# Patient Record
Sex: Female | Born: 1980 | Hispanic: No | Marital: Married | Smoking: Never smoker
Health system: Southern US, Community
[De-identification: ages and names within clinical notes are randomized; demographics above are authoritative.]

## PROBLEM LIST (undated history)

## (undated) ENCOUNTER — Inpatient Hospital Stay (HOSPITAL_COMMUNITY): Payer: Self-pay

## (undated) DIAGNOSIS — Z789 Other specified health status: Secondary | ICD-10-CM

## (undated) HISTORY — PX: NO PAST SURGERIES: SHX2092

---

## 2005-12-07 ENCOUNTER — Inpatient Hospital Stay (HOSPITAL_COMMUNITY): Admission: AD | Admit: 2005-12-07 | Discharge: 2005-12-09 | Payer: Self-pay | Admitting: Obstetrics & Gynecology

## 2010-02-01 ENCOUNTER — Inpatient Hospital Stay (HOSPITAL_COMMUNITY): Admission: AD | Admit: 2010-02-01 | Discharge: 2010-02-02 | Payer: Self-pay | Admitting: Obstetrics and Gynecology

## 2010-06-19 ENCOUNTER — Inpatient Hospital Stay (HOSPITAL_COMMUNITY)
Admission: AD | Admit: 2010-06-19 | Discharge: 2010-06-19 | Disposition: A | Discharge status: Home / Self Care | Payer: Medicaid Other | Source: Ambulatory Visit | Attending: Obstetrics and Gynecology | Admitting: Obstetrics and Gynecology

## 2010-06-19 DIAGNOSIS — N72 Inflammatory disease of cervix uteri: Secondary | ICD-10-CM | POA: Insufficient documentation

## 2010-06-19 DIAGNOSIS — R300 Dysuria: Secondary | ICD-10-CM

## 2010-06-19 DIAGNOSIS — N390 Urinary tract infection, site not specified: Secondary | ICD-10-CM

## 2010-06-19 LAB — URINALYSIS, ROUTINE W REFLEX MICROSCOPIC
Bilirubin Urine: NEGATIVE
Nitrite: NEGATIVE
Specific Gravity, Urine: 1.015 (ref 1.005–1.030)
Urobilinogen, UA: 0.2 mg/dL (ref 0.0–1.0)

## 2010-06-19 LAB — URINE MICROSCOPIC-ADD ON

## 2010-06-19 LAB — WET PREP, GENITAL

## 2010-06-19 LAB — POCT PREGNANCY, URINE: Preg Test, Ur: NEGATIVE

## 2010-06-20 LAB — GC/CHLAMYDIA PROBE AMP, GENITAL: GC Probe Amp, Genital: NEGATIVE

## 2010-06-26 ENCOUNTER — Inpatient Hospital Stay (HOSPITAL_COMMUNITY)
Admission: AD | Admit: 2010-06-26 | Discharge: 2010-06-26 | Disposition: A | Discharge status: Home / Self Care | Payer: Medicaid Other | Source: Ambulatory Visit | Attending: Obstetrics & Gynecology | Admitting: Obstetrics & Gynecology

## 2010-06-26 DIAGNOSIS — R300 Dysuria: Secondary | ICD-10-CM | POA: Insufficient documentation

## 2010-06-26 LAB — URINALYSIS, ROUTINE W REFLEX MICROSCOPIC
Leukocytes, UA: NEGATIVE
Protein, ur: NEGATIVE mg/dL
Urine Glucose, Fasting: NEGATIVE mg/dL
Urobilinogen, UA: 0.2 mg/dL (ref 0.0–1.0)

## 2010-06-26 LAB — URINE MICROSCOPIC-ADD ON

## 2010-06-28 LAB — URINE CULTURE
Colony Count: NO GROWTH
Culture: NO GROWTH

## 2010-07-03 ENCOUNTER — Inpatient Hospital Stay (HOSPITAL_COMMUNITY)
Admission: AD | Admit: 2010-07-03 | Discharge: 2010-07-03 | Disposition: A | Discharge status: Home / Self Care | Payer: Medicaid Other | Source: Ambulatory Visit | Attending: Obstetrics & Gynecology | Admitting: Obstetrics & Gynecology

## 2010-07-03 ENCOUNTER — Inpatient Hospital Stay (HOSPITAL_COMMUNITY): Payer: Medicaid Other

## 2010-07-03 DIAGNOSIS — R300 Dysuria: Secondary | ICD-10-CM | POA: Insufficient documentation

## 2010-07-03 DIAGNOSIS — N949 Unspecified condition associated with female genital organs and menstrual cycle: Secondary | ICD-10-CM | POA: Insufficient documentation

## 2010-07-03 LAB — URINALYSIS, ROUTINE W REFLEX MICROSCOPIC
Leukocytes, UA: NEGATIVE
Protein, ur: NEGATIVE mg/dL
Urobilinogen, UA: 0.2 mg/dL (ref 0.0–1.0)

## 2010-07-03 LAB — URINE MICROSCOPIC-ADD ON

## 2010-07-20 LAB — URINALYSIS, ROUTINE W REFLEX MICROSCOPIC
Bilirubin Urine: NEGATIVE
Nitrite: NEGATIVE
Specific Gravity, Urine: >1.03 — ABNORMAL HIGH (ref 1.005–1.030)
pH: 5.5 (ref 5.0–8.0)

## 2010-07-20 LAB — POCT PREGNANCY, URINE: Preg Test, Ur: POSITIVE

## 2010-07-20 LAB — URINE MICROSCOPIC-ADD ON

## 2010-07-20 LAB — WET PREP, GENITAL

## 2010-07-20 LAB — CBC
MCHC: 34.3 g/dL (ref 30.0–36.0)
MCV: 92.3 fL (ref 78.0–100.0)
Platelets: 235 10*3/uL (ref 150–400)
RDW: 13.4 % (ref 11.5–15.5)
WBC: 11.1 10*3/uL — ABNORMAL HIGH (ref 4.0–10.5)

## 2010-07-20 LAB — GC/CHLAMYDIA PROBE AMP, GENITAL: GC Probe Amp, Genital: NEGATIVE

## 2010-08-25 ENCOUNTER — Inpatient Hospital Stay (HOSPITAL_COMMUNITY): Admission: AD | Admit: 2010-08-25 | Payer: Self-pay | Admitting: Family Medicine

## 2010-12-27 ENCOUNTER — Other Ambulatory Visit: Payer: Self-pay | Admitting: Family Medicine

## 2011-05-08 NOTE — L&D Delivery Note (Signed)
Delivery Note At 3:55 AM a viable and healthy female was delivered in car at door of [facility] via Vaginal, Spontaneous Delivery (Presentation: unknown ).  APGAR: pending, ; weight pending .   Placenta status: intact , .  Cord:  with the following complications: none .  Cord pH: n/a  Anesthesia:  None Episiotomy: None Lacerations: 2nd Degree Suture Repair: 3.0 vicryl rapide Est. Blood Loss (mL): 350  Mom to postpartum.  Baby to nursery-stable.  [facility] 12/16/2011, 4:24 AM

## 2011-05-09 IMAGING — US US RENAL
1 series · 14 of 25 positions shown · non-contrast
Comparison: None

CLINICAL DATA: Suprapubic pain and dysuria.  Micro hematuria.

RENAL/URINARY TRACT ULTRASOUND COMPLETE

[Series 1: us renal · 14 of 61 slices shown]
[im 1/61]
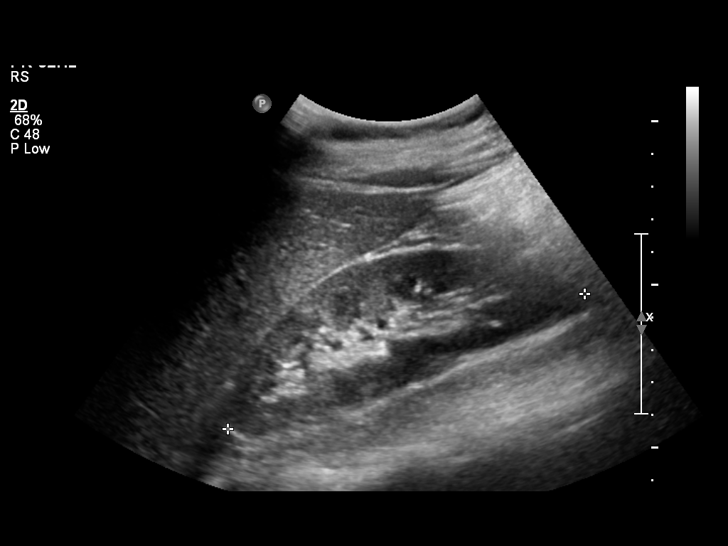
[im 6/61]
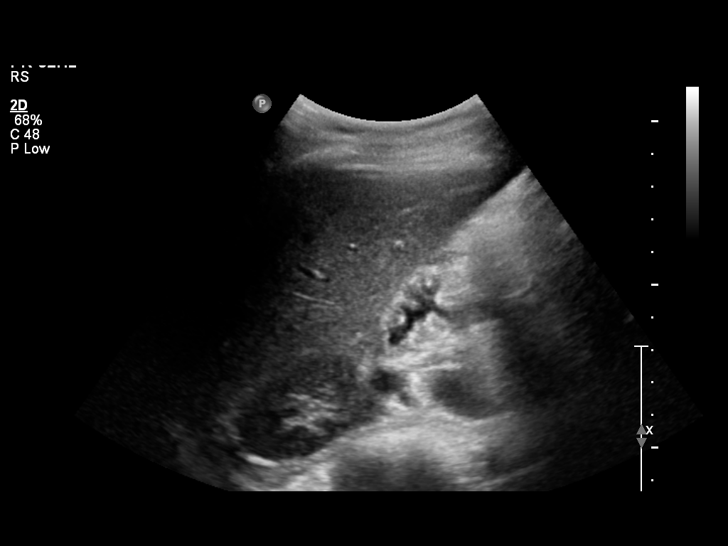
[im 11/61]
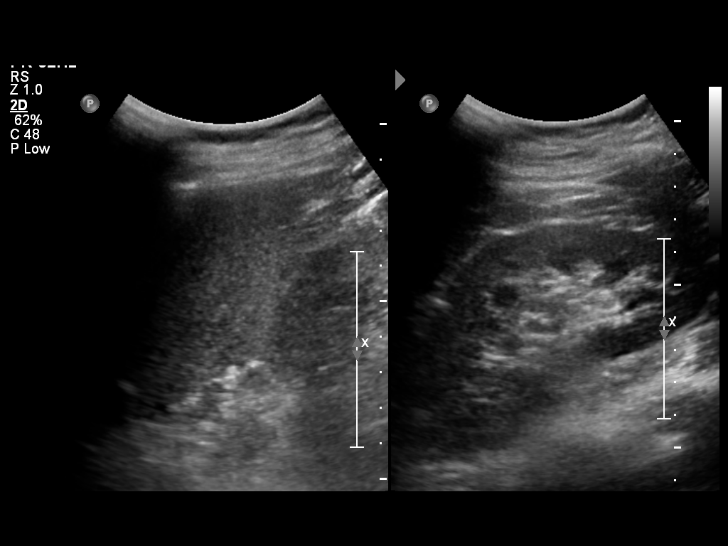
[im 16/61]
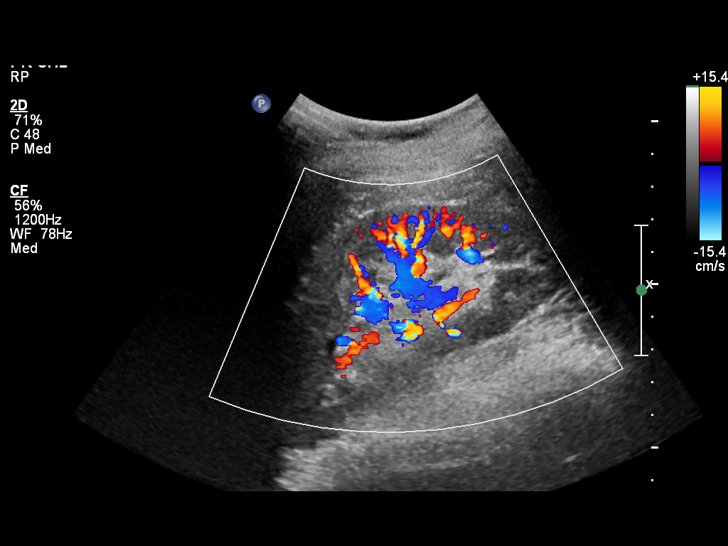
[im 21/61]
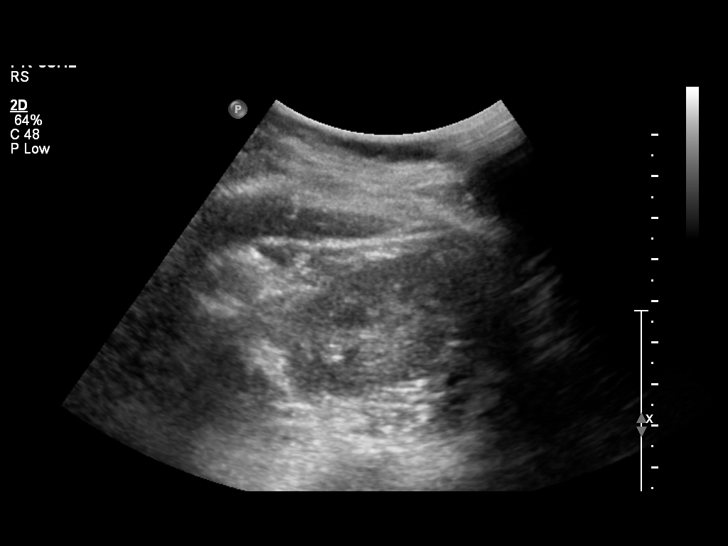
[im 23/61]
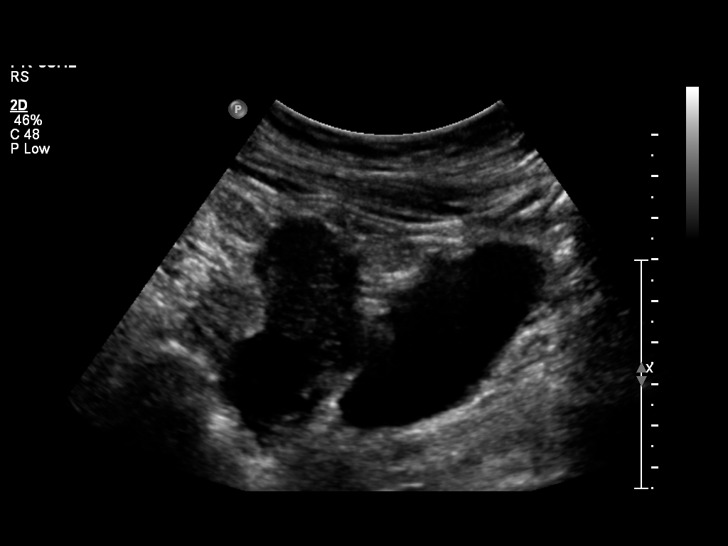
[im 28/61]
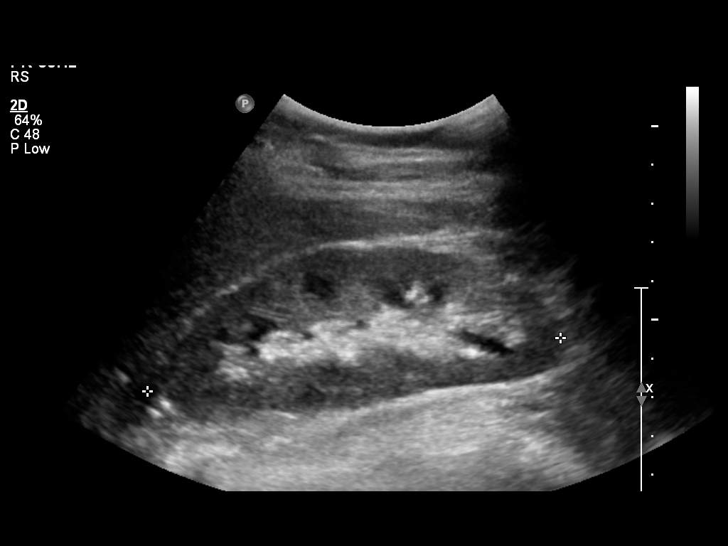
[im 33/61]
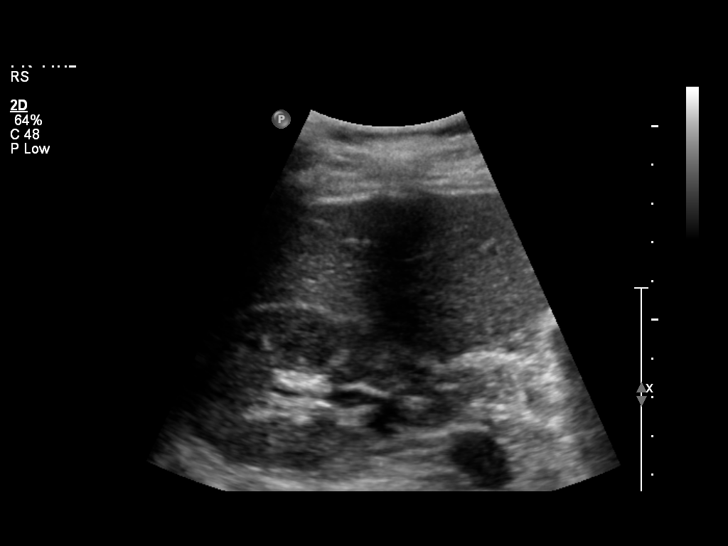
[im 38/61]
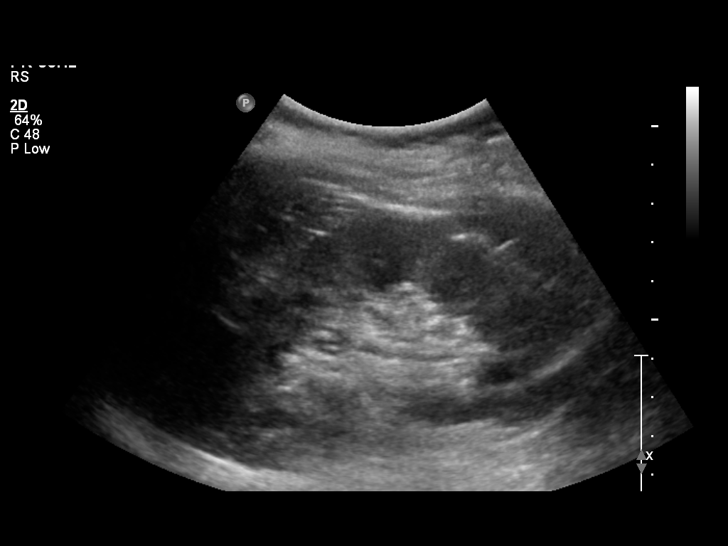
[im 41/61]
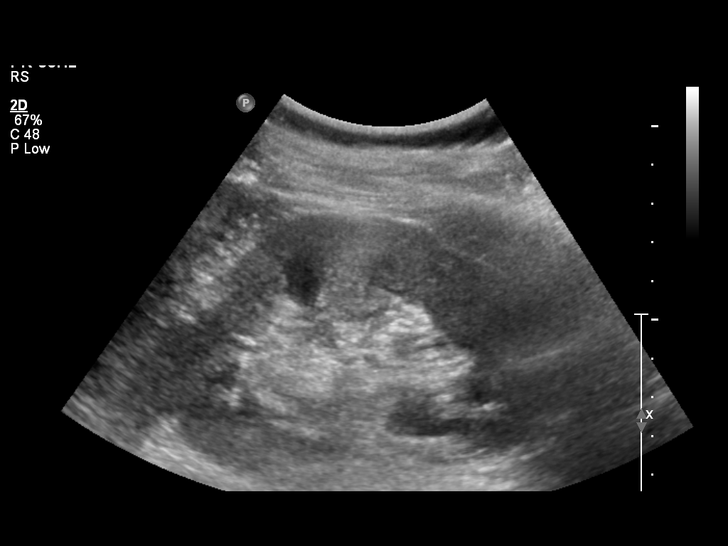
[im 46/61]
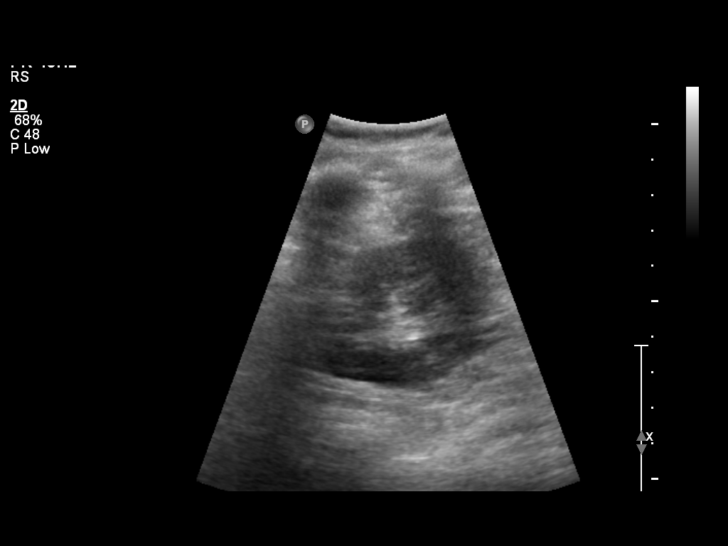
[im 51/61]
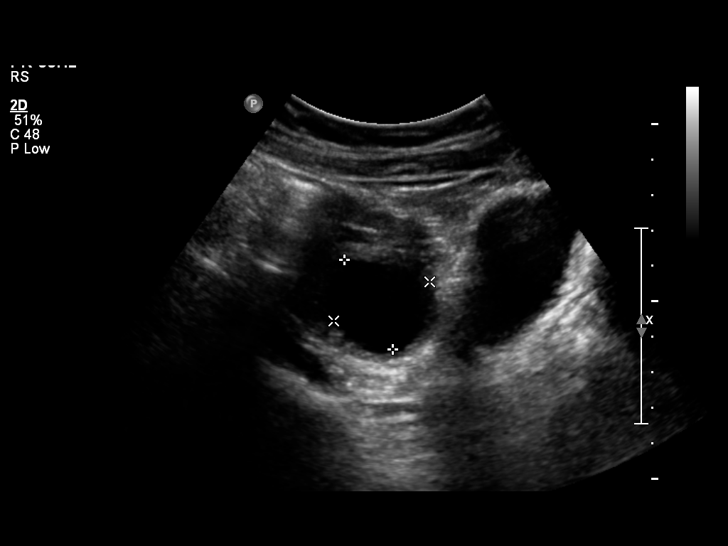
[im 56/61]
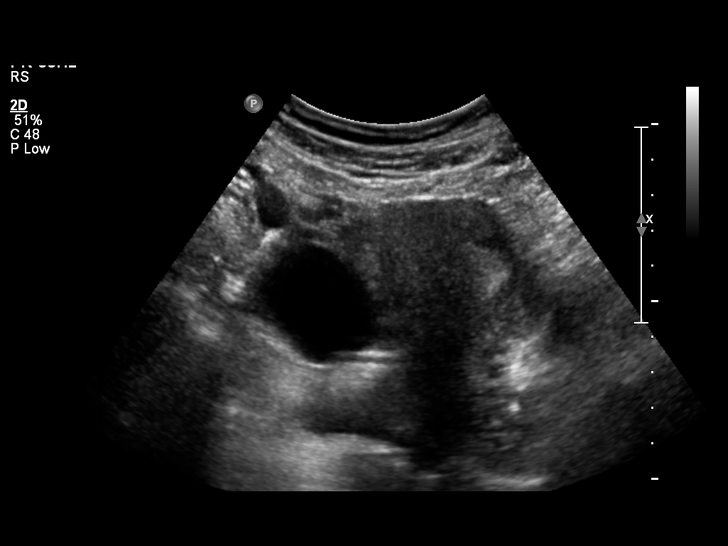
[im 61/61]
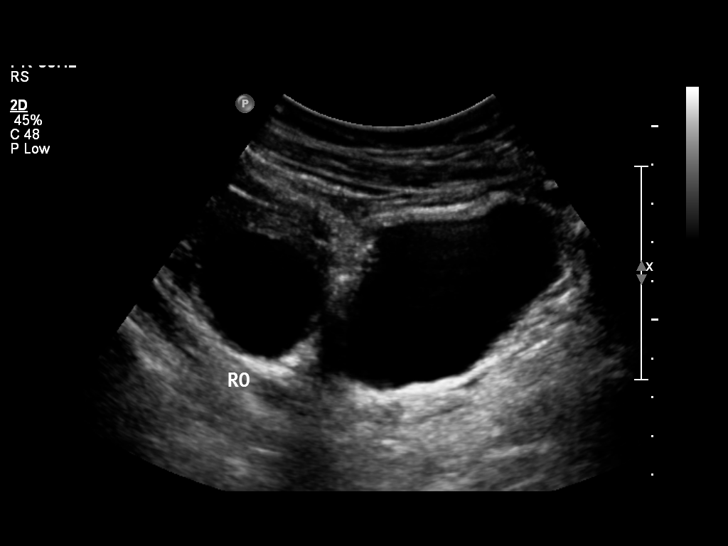

[14 of 25 positions shown; findings below may reference images not displayed]

FINDINGS: Right Kidney:  11.7 cm in length. Normal renal cortical thickness
and echogenicity without focal lesions or hydronephrosis.

Left Kidney:  11.3 cm in length. Normal renal cortical thickness
and echogenicity without focal lesions or hydronephrosis.

Bladder:  Normal.  Bilateral ureteral jets are demonstrated.
Incidental 2.9 x 2.6 x 2.9 cm right ovarian cyst is noted.
IMPRESSION: Normal renal ultrasound examination.

## 2011-06-22 ENCOUNTER — Inpatient Hospital Stay (HOSPITAL_COMMUNITY)
Admission: AD | Admit: 2011-06-22 | Discharge: 2011-06-22 | Disposition: A | Discharge status: Home / Self Care | Payer: Medicaid Other | Source: Ambulatory Visit | Attending: Obstetrics & Gynecology | Admitting: Obstetrics & Gynecology

## 2011-06-22 ENCOUNTER — Encounter (HOSPITAL_COMMUNITY): Payer: Self-pay | Admitting: *Deleted

## 2011-06-22 DIAGNOSIS — B3731 Acute candidiasis of vulva and vagina: Secondary | ICD-10-CM | POA: Insufficient documentation

## 2011-06-22 DIAGNOSIS — O26899 Other specified pregnancy related conditions, unspecified trimester: Secondary | ICD-10-CM

## 2011-06-22 DIAGNOSIS — R300 Dysuria: Secondary | ICD-10-CM | POA: Insufficient documentation

## 2011-06-22 DIAGNOSIS — B373 Candidiasis of vulva and vagina: Secondary | ICD-10-CM | POA: Insufficient documentation

## 2011-06-22 HISTORY — DX: Other specified health status: Z78.9

## 2011-06-22 LAB — URINALYSIS, ROUTINE W REFLEX MICROSCOPIC
Nitrite: NEGATIVE
Specific Gravity, Urine: 1.02 (ref 1.005–1.030)
Urobilinogen, UA: 0.2 mg/dL (ref 0.0–1.0)

## 2011-06-22 LAB — URINE MICROSCOPIC-ADD ON

## 2011-06-22 LAB — PREGNANCY, URINE: Preg Test, Ur: POSITIVE — AB

## 2011-06-22 LAB — WET PREP, GENITAL: Clue Cells Wet Prep HPF POC: NONE SEEN

## 2011-06-22 MED ORDER — FLUCONAZOLE 150 MG PO TABS
150.0000 mg | ORAL_TABLET | Freq: Once | ORAL | Status: AC
  Administered 2011-06-22: 150 mg via ORAL
  Filled 2011-06-22: qty 1

## 2011-06-22 MED ORDER — NITROFURANTOIN MONOHYD MACRO 100 MG PO CAPS: 100.0000 mg | ORAL_CAPSULE | Freq: Two times a day (BID) | ORAL | Status: AC

## 2011-06-22 MED ORDER — NITROFURANTOIN MONOHYD MACRO 100 MG PO CAPS
100.0000 mg | ORAL_CAPSULE | Freq: Once | ORAL | Status: AC
  Administered 2011-06-22: 100 mg via ORAL
  Filled 2011-06-22: qty 1

## 2011-06-22 NOTE — ED Provider Notes (Signed)
Medical Screening exam and patient care preformed by advanced practice provider.  Agree with the above management.  

## 2011-06-22 NOTE — ED Provider Notes (Signed)
History     Chief Complaint  Patient presents with  . Dysuria   HPI  Pt is [redacted]w[redacted]d pregnant by LMP 03/15/2011.  She presents with pain and burning with urination for 3 days.  She denies vaginal bleeding or spotting, chills or fever.    Past Medical History  Diagnosis Date  . No pertinent past medical history     Past Surgical History  Procedure Date  . No past surgeries     Family History  Problem Relation Age of Onset  . Anesthesia problems Neg Hx     History  Substance Use Topics  . Smoking status: Never Smoker   . Smokeless tobacco: Never Used  . Alcohol Use: No    Allergies: No Known Allergies  No prescriptions prior to admission    ROS Physical Exam   Blood pressure 105/71, pulse 93, temperature 98.5 F (36.9 C), temperature source Oral, resp. rate 20, height 5\' 2"  (1.575 m), weight 146 lb (66.225 kg), last menstrual period 03/15/2011, SpO2 100.00%.  Physical Exam  Vitals reviewed. Constitutional: She is oriented to person, place, and time. She appears well-developed and well-nourished.  HENT:  Head: Normocephalic.  Eyes: Pupils are equal, round, and reactive to light.  Neck: Normal range of motion.  Cardiovascular: Normal rate.   Respiratory: Effort normal.  GI: Soft.       FHT obtained  Genitourinary:       Vaginal mucosa reddened;Mod amount of white thick vaginal discharge with some yellow discharge; cervix clean NT; Uterus 14 week size mild bilateral adnexal tenderness no rebound  Musculoskeletal: Normal range of motion.  Neurological: She is alert and oriented to person, place, and time.  Skin: Skin is warm and dry.  Psychiatric: She has a normal mood and affect.    MAU Course  Procedures Initially difficult to obtain FHTs and urine pregnancy test was ordered- then [facility] were obtained - lab was contacted to cancel order, but not able to cancel since already sent to lab. Review of records shows a normal renal ultrasound performed on 2/12 for  dysuria and suprapubic pain and microhematuria.pt had urine culture that did not show any growth.  Pt was seen at [facility].  Pt says that she has had a UTI since her visit on 2/12 and was treated at the [facility]. Results for orders placed during the hospital encounter of 06/22/11 (from the past 24 hour(s))  URINALYSIS, ROUTINE W REFLEX MICROSCOPIC     Status: Abnormal   Collection Time   06/22/11 10:00 AM      Component Value Range   Color, Urine YELLOW  YELLOW    APPearance CLEAR  CLEAR    Specific Gravity, Urine 1.020  1.005 - 1.030    pH 6.0  5.0 - 8.0    Glucose, UA NEGATIVE  NEGATIVE (mg/dL)   Hgb urine dipstick TRACE (*) NEGATIVE    Bilirubin Urine NEGATIVE  NEGATIVE    Ketones, ur NEGATIVE  NEGATIVE (mg/dL)   Protein, ur NEGATIVE  NEGATIVE (mg/dL)   Urobilinogen, UA 0.2  0.0 - 1.0 (mg/dL)   Nitrite NEGATIVE  NEGATIVE    Leukocytes, UA LARGE (*) NEGATIVE   PREGNANCY, URINE     Status: Abnormal   Collection Time   06/22/11 10:00 AM      Component Value Range   Preg Test, Ur POSITIVE (*) NEGATIVE   URINE MICROSCOPIC-ADD ON     Status: Abnormal   Collection Time   06/22/11 10:00 AM  Component Value Range   Squamous Epithelial / LPF MANY (*) RARE    WBC, UA TOO NUMEROUS TO COUNT  <3 (WBC/hpf)   RBC / HPF 0-2  <3 (RBC/hpf)   Bacteria, UA FEW (*) RARE   WET PREP, GENITAL     Status: Abnormal   Collection Time   06/22/11 12:10 PM      Component Value Range   Yeast Wet Prep HPF POC NONE SEEN  NONE SEEN    Trich, Wet Prep NONE SEEN  NONE SEEN    Clue Cells Wet Prep HPF POC NONE SEEN  NONE SEEN    WBC, Wet Prep HPF POC MANY (*) NONE SEEN    Urine culture pending GC/chlamydia NOT performed Assessment and Plan  Dysuria- will treat with Macrobid Urine culture pending Will also treat for yeast vaginitis in case pt's dysuria is due to yeast Pt has appt at HD for prenatal care  Danielle Travis 06/22/2011, 10:31 AM

## 2011-06-22 NOTE — Progress Notes (Signed)
Pain with urination. Frequency, past 3 days.also severe constipation

## 2011-06-22 NOTE — Discharge Instructions (Signed)

## 2011-07-03 LAB — OB RESULTS CONSOLE ABO/RH

## 2011-07-03 LAB — OB RESULTS CONSOLE RUBELLA ANTIBODY, IGM: Rubella: IMMUNE

## 2011-07-03 LAB — OB RESULTS CONSOLE ANTIBODY SCREEN: Antibody Screen: NEGATIVE

## 2011-07-19 ENCOUNTER — Inpatient Hospital Stay (HOSPITAL_COMMUNITY)
Admission: AD | Admit: 2011-07-19 | Discharge: 2011-07-19 | Disposition: A | Discharge status: Home / Self Care | Payer: Medicaid Other | Source: Ambulatory Visit | Attending: Obstetrics | Admitting: Obstetrics

## 2011-07-19 ENCOUNTER — Encounter (HOSPITAL_COMMUNITY): Payer: Self-pay | Admitting: *Deleted

## 2011-07-19 DIAGNOSIS — O2340 Unspecified infection of urinary tract in pregnancy, unspecified trimester: Secondary | ICD-10-CM

## 2011-07-19 DIAGNOSIS — R109 Unspecified abdominal pain: Secondary | ICD-10-CM | POA: Insufficient documentation

## 2011-07-19 DIAGNOSIS — N390 Urinary tract infection, site not specified: Secondary | ICD-10-CM

## 2011-07-19 DIAGNOSIS — O2390 Unspecified genitourinary tract infection in pregnancy, unspecified trimester: Secondary | ICD-10-CM

## 2011-07-19 LAB — URINALYSIS, ROUTINE W REFLEX MICROSCOPIC
Glucose, UA: NEGATIVE mg/dL
Ketones, ur: NEGATIVE mg/dL
pH: 6 (ref 5.0–8.0)

## 2011-07-19 LAB — COMPREHENSIVE METABOLIC PANEL
ALT: 14 U/L (ref 0–35)
AST: 14 U/L (ref 0–37)
CO2: 22 mEq/L (ref 19–32)
Chloride: 103 mEq/L (ref 96–112)
GFR calc non Af Amer: >90 mL/min (ref >90)
Sodium: 134 mEq/L — ABNORMAL LOW (ref 135–145)
Total Bilirubin: 0.1 mg/dL — ABNORMAL LOW (ref 0.3–1.2)

## 2011-07-19 LAB — URINE MICROSCOPIC-ADD ON

## 2011-07-19 LAB — CBC
HCT: 34.4 % — ABNORMAL LOW (ref 36.0–46.0)
Platelets: 246 10*3/uL (ref 150–400)
RDW: 13.6 % (ref 11.5–15.5)
WBC: 10.2 10*3/uL (ref 4.0–10.5)

## 2011-07-19 LAB — DIFFERENTIAL
Basophils Absolute: 0 10*3/uL (ref 0.0–0.1)
Lymphocytes Relative: 25 % (ref 12–46)
Neutro Abs: 6.9 10*3/uL (ref 1.7–7.7)

## 2011-07-19 MED ORDER — CEFTRIAXONE SODIUM 1 G IJ SOLR
1.0000 g | Freq: Once | INTRAMUSCULAR | Status: AC
  Administered 2011-07-19: 1 g via INTRAMUSCULAR
  Filled 2011-07-19: qty 10

## 2011-07-19 MED ORDER — CEFUROXIME AXETIL 500 MG PO TABS: 500.0000 mg | ORAL_TABLET | Freq: Two times a day (BID) | ORAL | Status: AC

## 2011-07-19 NOTE — MAU Note (Signed)
Pain started yesterday. On going. Pain with urination.frequency.

## 2011-07-19 NOTE — Discharge Instructions (Signed)

## 2011-07-19 NOTE — MAU Note (Signed)
Denies n/v/d 

## 2011-07-19 NOTE — MAU Provider Note (Signed)
History     CSN: [id number]  Arrival date & time 07/19/11  0947   None     Chief Complaint  Patient presents with  . Abdominal Pain    HPI Danielle Travis is a 31 y.o. female @ [redacted]w[redacted]d gestation who presents to MAU for UTI symptoms. She reports frequency, burning and back pain that started a few days ago. Denies vaginal bleeding or discharge, denies fever, chills, nausea, vomiting or diarrhea. Has had some low back pain.  Past Medical History  Diagnosis Date  . No pertinent past medical history     Past Surgical History  Procedure Date  . No past surgeries     Family History  Problem Relation Age of Onset  . Anesthesia problems Neg Hx     History  Substance Use Topics  . Smoking status: Never Smoker   . Smokeless tobacco: Never Used  . Alcohol Use: No    OB History    Grav Para Term Preterm Abortions TAB SAB Ect Mult Living   4 2 2  0 1 1 0 0 0 2     Results for orders placed during the hospital encounter of 07/19/11 (from the past 24 hour(s))  URINALYSIS, ROUTINE W REFLEX MICROSCOPIC     Status: Abnormal   Collection Time   07/19/11 10:00 AM      Component Value Range   Color, Urine YELLOW  YELLOW    APPearance CLEAR  CLEAR    Specific Gravity, Urine 1.025  1.005 - 1.030    pH 6.0  5.0 - 8.0    Glucose, UA NEGATIVE  NEGATIVE (mg/dL)   Hgb urine dipstick TRACE (*) NEGATIVE    Bilirubin Urine NEGATIVE  NEGATIVE    Ketones, ur NEGATIVE  NEGATIVE (mg/dL)   Protein, ur NEGATIVE  NEGATIVE (mg/dL)   Urobilinogen, UA 0.2  0.0 - 1.0 (mg/dL)   Nitrite NEGATIVE  NEGATIVE    Leukocytes, UA SMALL (*) NEGATIVE   URINE MICROSCOPIC-ADD ON     Status: Abnormal   Collection Time   07/19/11 10:00 AM      Component Value Range   Squamous Epithelial / LPF FEW (*) RARE    WBC, UA 11-20  <3 (WBC/hpf)   Bacteria, UA RARE  RARE   CBC     Status: Abnormal   Collection Time   07/19/11 11:20 AM      Component Value Range   WBC 10.2  4.0 - 10.5 (K/uL)   RBC 3.81 (*) 3.87 - 5.11  (MIL/uL)   Hemoglobin 11.5 (*) 12.0 - 15.0 (g/dL)   HCT 40.9 (*) 81.1 - 46.0 (%)   MCV 90.3  78.0 - 100.0 (fL)   MCH 30.2  26.0 - 34.0 (pg)   MCHC 33.4  30.0 - 36.0 (g/dL)   RDW 91.4  78.2 - 95.6 (%)   Platelets 246  150 - 400 (K/uL)  DIFFERENTIAL     Status: Normal   Collection Time   07/19/11 11:20 AM      Component Value Range   Neutrophils Relative 68  43 - 77 (%)   Neutro Abs 6.9  1.7 - 7.7 (K/uL)   Lymphocytes Relative 25  12 - 46 (%)   Lymphs Abs 2.6  0.7 - 4.0 (K/uL)   Monocytes Relative 7  3 - 12 (%)   Monocytes Absolute 0.7  0.1 - 1.0 (K/uL)   Eosinophils Relative 1  0 - 5 (%)   Eosinophils Absolute 0.1  0.0 -  0.7 (K/uL)   Basophils Relative 0  0 - 1 (%)   Basophils Absolute 0.0  0.0 - 0.1 (K/uL)  COMPREHENSIVE METABOLIC PANEL     Status: Abnormal   Collection Time   07/19/11 11:20 AM      Component Value Range   Sodium 134 (*) 135 - 145 (mEq/L)   Potassium 3.7  3.5 - 5.1 (mEq/L)   Chloride 103  96 - 112 (mEq/L)   CO2 22  19 - 32 (mEq/L)   Glucose, Bld 87  70 - 99 (mg/dL)   BUN 7  6 - 23 (mg/dL)   Creatinine, Ser 1.61 (*) 0.50 - 1.10 (mg/dL)   Calcium 9.3  8.4 - 09.6 (mg/dL)   Total Protein 6.4  6.0 - 8.3 (g/dL)   Albumin 2.8 (*) 3.5 - 5.2 (g/dL)   AST 14  0 - 37 (U/L)   ALT 14  0 - 35 (U/L)   Alkaline Phosphatase 58  39 - 117 (U/L)   Total Bilirubin 0.1 (*) 0.3 - 1.2 (mg/dL)   GFR calc non Af Amer >90  >90 (mL/min)   GFR calc Af Amer >90  >90 (mL/min)    Review of Systems As stated in HPI  Allergies  Review of patient's allergies indicates no known allergies.  Home Medications  No current outpatient prescriptions on file.  BP 118/70  Pulse 101  Temp(Src) 97.6 F (36.4 C) (Oral)  Resp 20  Ht 5\' 2"  (1.575 m)  Wt 150 lb (68.04 kg)  BMI 27.44 kg/m2  SpO2 100%  LMP 03/15/2011  Physical Exam  Nursing note and vitals reviewed. Constitutional: She is oriented to person, place, and time. She appears well-developed and well-nourished.  HENT:  Head:  Normocephalic.  Eyes: EOM are normal.  Neck: Neck supple.  Cardiovascular:       Tachycardia   Pulmonary/Chest: Effort normal.  Abdominal: Soft. There is tenderness in the suprapubic area. CVA tenderness: left flank pain.  Musculoskeletal: Normal range of motion.  Neurological: She is alert and oriented to person, place, and time. No cranial nerve deficit.  Skin: Skin is warm and dry.  Psychiatric: She has a normal mood and affect. Her behavior is normal. Judgment and thought content normal.   Assessment: UTI @ [redacted] weeks gestation  Plan:  Rocephin 1 gram IM   Rx Ceftin   Urine culture pending   Follow up in the office  ED Course: discussed with Dr. Clearance Coots and he request that the patient received rocephin 1 gram IM and go home with Rx for Ceftin.  Procedures   MDM

## 2011-07-20 LAB — URINE CULTURE

## 2011-10-02 LAB — OB RESULTS CONSOLE HIV ANTIBODY (ROUTINE TESTING): HIV: NONREACTIVE

## 2011-10-06 ENCOUNTER — Encounter: Payer: Medicaid Other | Attending: Nurse Practitioner | Admitting: Dietician

## 2011-10-06 ENCOUNTER — Encounter: Payer: Self-pay | Admitting: Dietician

## 2011-10-06 DIAGNOSIS — O99810 Abnormal glucose complicating pregnancy: Secondary | ICD-10-CM | POA: Insufficient documentation

## 2011-10-06 DIAGNOSIS — Z713 Dietary counseling and surveillance: Secondary | ICD-10-CM | POA: Insufficient documentation

## 2011-10-06 NOTE — Progress Notes (Signed)
  Patient was seen on 10/06/2011 for Gestational Diabetes self-management instruction at the [facility]. The Arabic interpreter assisted with the teaching session.  Her husband and son were present during the counseling session.The following learning objectives were met by the patient during this Session:   States the definition of Gestational Diabetes  States why dietary management is important in controlling blood glucose  Describes the effects each nutrient has on blood glucose levels  Demonstrates ability to create a balanced meal plan  Demonstrates carbohydrate counting   States when to check blood glucose levels  Demonstrates proper blood glucose monitoring techniques  States the effect of stress and exercise on blood glucose levels  States the importance of limiting caffeine and abstaining from alcohol and smoking  Blood glucose monitor given: Control and instrumentation engineer Kit.  Will need the Accu-Chek SmartView strips and the Accu-Chek Fastclix lancets Lot # [id number] Exp: 11/03/2012 Blood glucose reading: 101 @ 9:40 AM fasting  Patient instructed to monitor glucose levels: FBS: 60 - <90 2 hour: <120  Patient received handouts:  Handout was in Arabic language  Nutrition Diabetes and Pregnancy  Carbohydrate Counting List  Patient will be seen for follow-up as needed.

## 2011-10-19 ENCOUNTER — Ambulatory Visit: Payer: Medicaid Other | Admitting: *Deleted

## 2011-11-12 ENCOUNTER — Other Ambulatory Visit: Payer: Self-pay | Admitting: Obstetrics & Gynecology

## 2011-11-12 DIAGNOSIS — Z3689 Encounter for other specified antenatal screening: Secondary | ICD-10-CM

## 2011-11-26 ENCOUNTER — Ambulatory Visit (HOSPITAL_COMMUNITY)
Admission: RE | Admit: 2011-11-26 | Discharge: 2011-11-26 | Disposition: A | Discharge status: Home / Self Care | Payer: Medicaid Other | Source: Ambulatory Visit | Attending: Obstetrics & Gynecology | Admitting: Obstetrics & Gynecology

## 2011-11-26 DIAGNOSIS — O99810 Abnormal glucose complicating pregnancy: Secondary | ICD-10-CM | POA: Insufficient documentation

## 2011-11-26 DIAGNOSIS — Z3689 Encounter for other specified antenatal screening: Secondary | ICD-10-CM | POA: Insufficient documentation

## 2011-12-15 ENCOUNTER — Encounter (HOSPITAL_COMMUNITY): Payer: Self-pay | Admitting: Obstetrics and Gynecology

## 2011-12-15 ENCOUNTER — Inpatient Hospital Stay (HOSPITAL_COMMUNITY)
Admission: AD | Admit: 2011-12-15 | Discharge: 2011-12-15 | Disposition: A | Discharge status: Home / Self Care | Payer: Medicaid Other | Source: Ambulatory Visit | Attending: Obstetrics & Gynecology | Admitting: Obstetrics & Gynecology

## 2011-12-15 DIAGNOSIS — O99891 Other specified diseases and conditions complicating pregnancy: Secondary | ICD-10-CM | POA: Insufficient documentation

## 2011-12-15 DIAGNOSIS — M545 Low back pain, unspecified: Secondary | ICD-10-CM | POA: Insufficient documentation

## 2011-12-15 DIAGNOSIS — N949 Unspecified condition associated with female genital organs and menstrual cycle: Secondary | ICD-10-CM | POA: Insufficient documentation

## 2011-12-15 DIAGNOSIS — R109 Unspecified abdominal pain: Secondary | ICD-10-CM | POA: Insufficient documentation

## 2011-12-15 LAB — POCT FERN TEST: Fern Test: NEGATIVE

## 2011-12-15 NOTE — MAU Note (Signed)
Pt presents to MAU with chief complaint of vaginal discharge that she describes as thick and white. Pt says she is having contractions every 15 mins and feels this may be labor because she is 39 weeks. Pt is a Dentist

## 2011-12-15 NOTE — MAU Note (Signed)
Pt reports having some fluid leak out around 1200 an now having a discharge. Also c/o pain in her stomach and lower back.

## 2011-12-16 ENCOUNTER — Encounter (HOSPITAL_COMMUNITY): Payer: Self-pay | Admitting: *Deleted

## 2011-12-16 ENCOUNTER — Inpatient Hospital Stay (HOSPITAL_COMMUNITY)
Admission: AD | Admit: 2011-12-16 | Discharge: 2011-12-18 | DRG: 776 | Disposition: A | Discharge status: Home / Self Care | Payer: Medicaid Other | Source: Ambulatory Visit | Attending: Obstetrics | Admitting: Obstetrics

## 2011-12-16 LAB — CBC
Hemoglobin: 12.3 g/dL (ref 12.0–15.0)
MCH: 29.1 pg (ref 26.0–34.0)
MCHC: 32.8 g/dL (ref 30.0–36.0)
MCV: 88.7 fL (ref 78.0–100.0)
Platelets: 210 10*3/uL (ref 150–400)
RBC: 4.23 MIL/uL (ref 3.87–5.11)

## 2011-12-16 LAB — RPR: RPR Ser Ql: NONREACTIVE

## 2011-12-16 MED ORDER — ONDANSETRON HCL 4 MG PO TABS: 4.0000 mg | ORAL_TABLET | ORAL | Status: DC | PRN

## 2011-12-16 MED ORDER — PRENATAL MULTIVITAMIN CH
1.0000 | ORAL_TABLET | Freq: Every day | ORAL | Status: DC
  Administered 2011-12-16 – 2011-12-18 (×3): 1 via ORAL
  Filled 2011-12-16 (×4): qty 1

## 2011-12-16 MED ORDER — WITCH HAZEL-GLYCERIN EX PADS: 1.0000 "application " | MEDICATED_PAD | CUTANEOUS | Status: DC | PRN

## 2011-12-16 MED ORDER — ZOLPIDEM TARTRATE 5 MG PO TABS: 5.0000 mg | ORAL_TABLET | Freq: Every evening | ORAL | Status: DC | PRN

## 2011-12-16 MED ORDER — ONDANSETRON HCL 4 MG/2ML IJ SOLN: 4.0000 mg | INTRAMUSCULAR | Status: DC | PRN

## 2011-12-16 MED ORDER — TETANUS-DIPHTH-ACELL PERTUSSIS 5-2.5-18.5 LF-MCG/0.5 IM SUSP
0.5000 mL | Freq: Once | INTRAMUSCULAR | Status: AC
  Administered 2011-12-17: 0.5 mL via INTRAMUSCULAR
  Filled 2011-12-16: qty 0.5

## 2011-12-16 MED ORDER — OXYCODONE-ACETAMINOPHEN 5-325 MG PO TABS
1.0000 | ORAL_TABLET | ORAL | Status: DC | PRN
  Administered 2011-12-16: 2 via ORAL
  Administered 2011-12-16: 1 via ORAL
  Administered 2011-12-17: 2 via ORAL
  Administered 2011-12-17: 1 via ORAL
  Administered 2011-12-17: 2 via ORAL
  Administered 2011-12-18: 1 via ORAL
  Filled 2011-12-16: qty 1
  Filled 2011-12-16 (×2): qty 2
  Filled 2011-12-16 (×2): qty 1
  Filled 2011-12-16: qty 2

## 2011-12-16 MED ORDER — LIDOCAINE HCL (PF) 1 % IJ SOLN
INTRAMUSCULAR | Status: AC
  Administered 2011-12-16: 30 mL
  Filled 2011-12-16: qty 30

## 2011-12-16 MED ORDER — BENZOCAINE-MENTHOL 20-0.5 % EX AERO
1.0000 "application " | INHALATION_SPRAY | CUTANEOUS | Status: DC | PRN
  Filled 2011-12-16 (×2): qty 56

## 2011-12-16 MED ORDER — SIMETHICONE 80 MG PO CHEW
80.0000 mg | CHEWABLE_TABLET | ORAL | Status: DC | PRN
  Filled 2011-12-16: qty 1

## 2011-12-16 MED ORDER — DIBUCAINE 1 % RE OINT
1.0000 "application " | TOPICAL_OINTMENT | RECTAL | Status: DC | PRN
  Filled 2011-12-16: qty 28

## 2011-12-16 MED ORDER — OXYTOCIN 10 UNIT/ML IJ SOLN
INTRAMUSCULAR | Status: AC
  Filled 2011-12-16: qty 1

## 2011-12-16 MED ORDER — LANOLIN HYDROUS EX OINT: TOPICAL_OINTMENT | CUTANEOUS | Status: DC | PRN

## 2011-12-16 MED ORDER — SENNOSIDES-DOCUSATE SODIUM 8.6-50 MG PO TABS
2.0000 | ORAL_TABLET | Freq: Every day | ORAL | Status: DC
  Administered 2011-12-16 – 2011-12-17 (×2): 2 via ORAL

## 2011-12-16 MED ORDER — OXYTOCIN 10 UNIT/ML IJ SOLN
10.0000 [IU] | Freq: Once | INTRAMUSCULAR | Status: AC
  Administered 2011-12-16: 10 [IU] via INTRAMUSCULAR

## 2011-12-16 MED ORDER — IBUPROFEN 600 MG PO TABS
600.0000 mg | ORAL_TABLET | Freq: Four times a day (QID) | ORAL | Status: DC
  Administered 2011-12-16 – 2011-12-18 (×9): 600 mg via ORAL
  Filled 2011-12-16 (×9): qty 1

## 2011-12-16 MED ORDER — DIPHENHYDRAMINE HCL 25 MG PO CAPS
25.0000 mg | ORAL_CAPSULE | Freq: Four times a day (QID) | ORAL | Status: DC | PRN
  Administered 2011-12-17: 25 mg via ORAL
  Filled 2011-12-16: qty 1

## 2011-12-16 NOTE — Progress Notes (Signed)
Called Dr. Gaynell Face (covering for [employer] this weekend) provided report on patient status and delivery.

## 2011-12-17 NOTE — Progress Notes (Signed)
UR chart review completed.  

## 2011-12-17 NOTE — Progress Notes (Signed)
Post Partum Day 1 Subjective: no complaints  Objective: Blood pressure 102/71, pulse 74, temperature 97.6 F (36.4 C), temperature source Oral, resp. rate 18, last menstrual period 03/15/2011, unknown if currently breastfeeding.  Physical Exam:  General: alert and no distress Lochia: appropriate Uterine Fundus: firm Incision: healing well DVT Evaluation: No evidence of DVT seen on physical exam.   Basename 12/16/11 0505  HGB 12.3  HCT 37.5    Assessment/Plan: Plan for discharge tomorrow   LOS: 1 day   HARPER,CHARLES A 12/17/2011, 8:56 AM

## 2011-12-18 MED ORDER — OXYCODONE-ACETAMINOPHEN 5-325 MG PO TABS: 1.0000 | ORAL_TABLET | ORAL | Status: AC | PRN

## 2011-12-18 MED ORDER — IBUPROFEN 600 MG PO TABS: 600.0000 mg | ORAL_TABLET | Freq: Four times a day (QID) | ORAL | Status: DC

## 2011-12-18 NOTE — Discharge Summary (Signed)
Obstetric Discharge Summary Reason for Admission: onset of labor Prenatal Procedures: none Intrapartum Procedures: spontaneous vaginal delivery Postpartum Procedures: none Complications-Operative and Postpartum: none Hemoglobin  Date Value Range Status  12/16/2011 12.3  12.0 - 15.0 g/dL Final     HCT  Date Value Range Status  12/16/2011 37.5  36.0 - 46.0 % Final    Physical Exam:  General: alert and no distress Lochia: appropriate Uterine Fundus: firm Incision: none DVT Evaluation: No evidence of DVT seen on physical exam.  Discharge Diagnoses: Term Pregnancy-delivered  Discharge Information: Date: 12/18/2011 Activity: pelvic rest Diet: routine Medications: PNV, Ibuprofen, Colace and Percocet Condition: stable Instructions: refer to practice specific booklet Discharge to: home Follow-up Information    Follow up with Antionette Char A, MD. Schedule an appointment as soon as possible for a visit in 6 weeks.   Contact information:   [address], [address] [place] [address] [phone]          Newborn Data: Live born female  Birth Weight: 6 lb 8.2 oz (2955 g) APGAR: ,   Home with mother.  HARPER,CHARLES A 12/18/2011, 9:21 AM

## 2011-12-18 NOTE — Progress Notes (Signed)
Post Partum Day 2 Subjective: no complaints  Objective: Blood pressure 92/62, pulse 65, temperature 98.1 F (36.7 C), temperature source Oral, resp. rate 18, last menstrual period 03/15/2011, SpO2 97.00%, unknown if currently breastfeeding.  Physical Exam:  General: alert and no distress Lochia: appropriate Uterine Fundus: firm Incision: healing well DVT Evaluation: No evidence of DVT seen on physical exam.   Basename 12/16/11 0505  HGB 12.3  HCT 37.5    Assessment/Plan: Discharge home   LOS: 2 days   HARPER,CHARLES A 12/18/2011, 9:16 AM

## 2012-10-01 IMAGING — US US OB COMP +14 WK
1 series · 12 of 28 positions shown · non-contrast
Comparison: none

[Series 1: us ob comp +14 wk · 71 acquisitions, 12 frames shown]
[im 3/71]
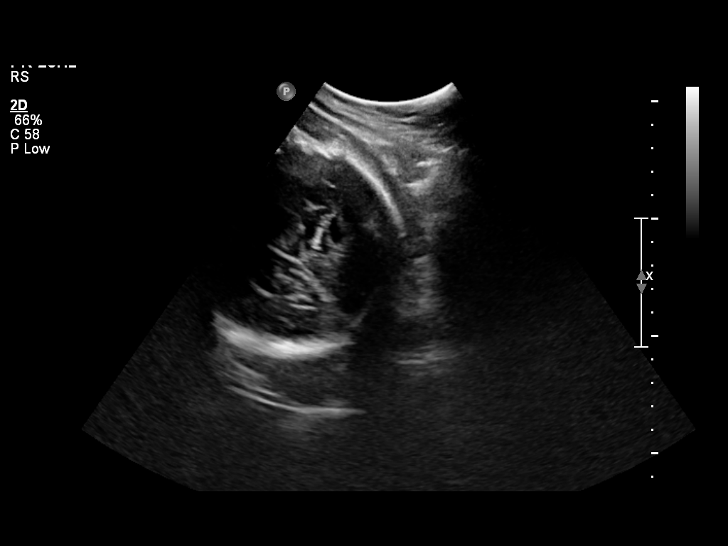
[im 8/71]
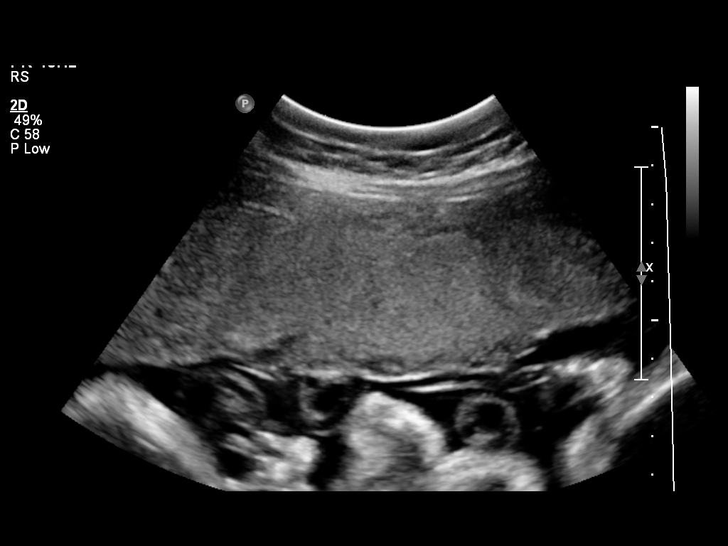
[im 13/71]
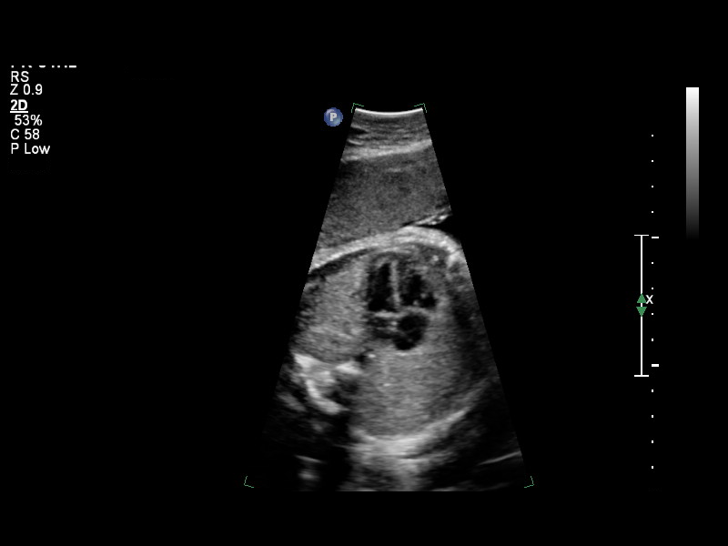
[im 21/71]
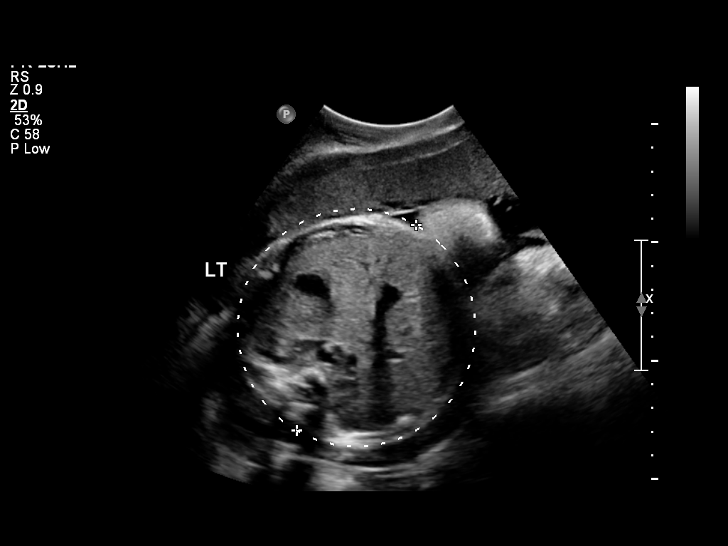
[im 26/71]
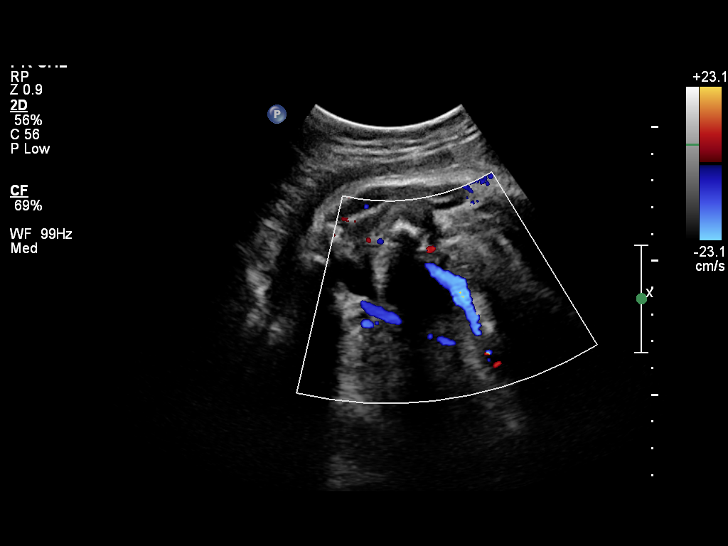
[im 32/71]
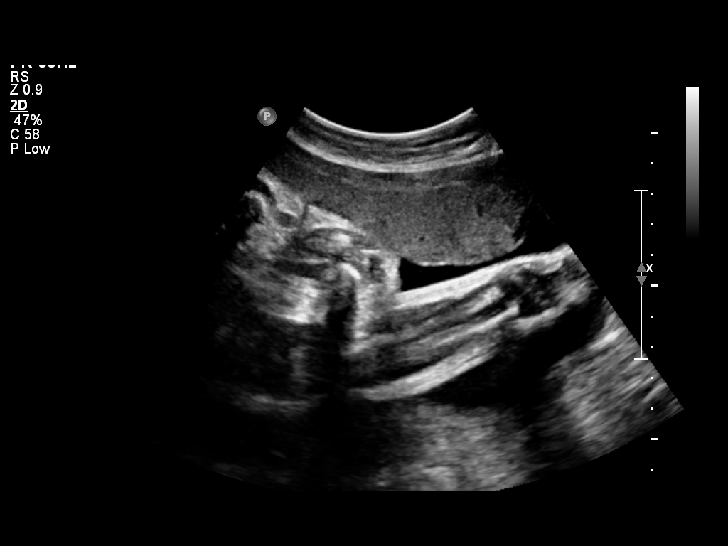
[im 39/71]
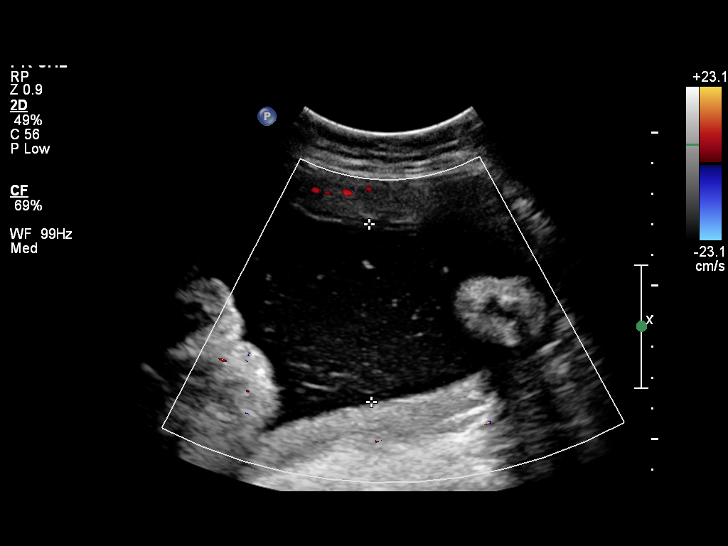
[im 45/71]
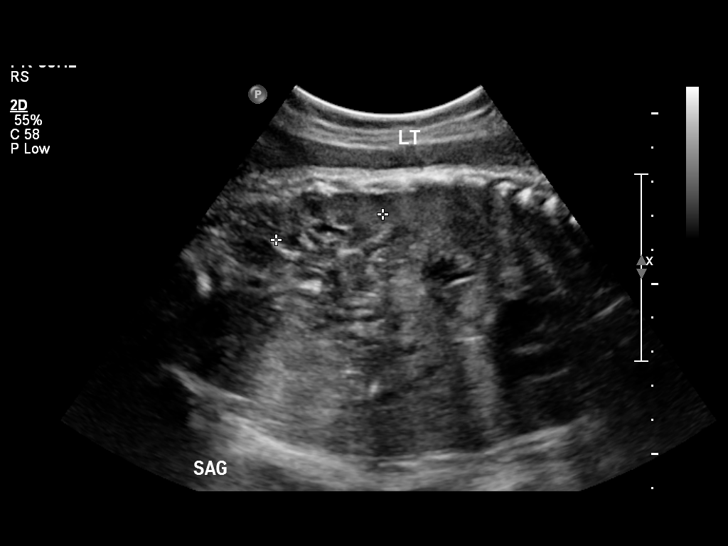
[im 50/71]
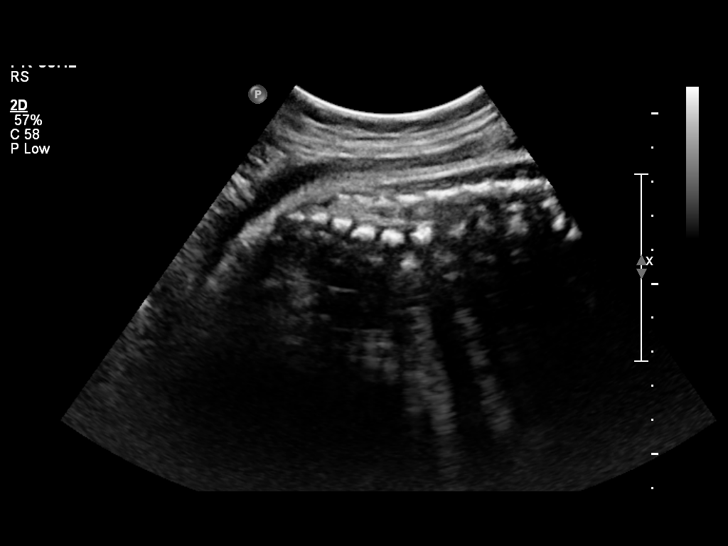
[im 58/71]
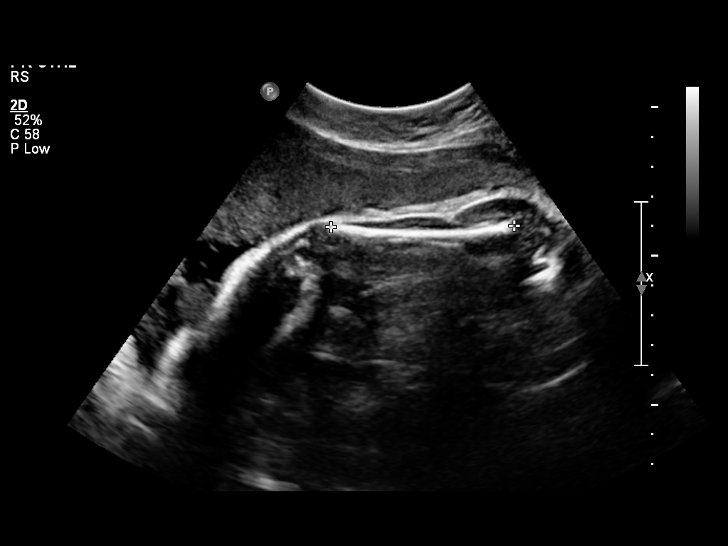
[im 63/71]
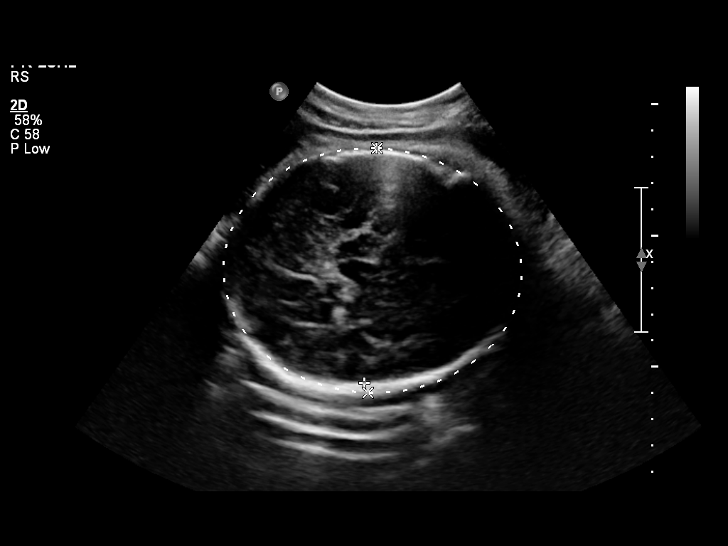
[im 68/71]
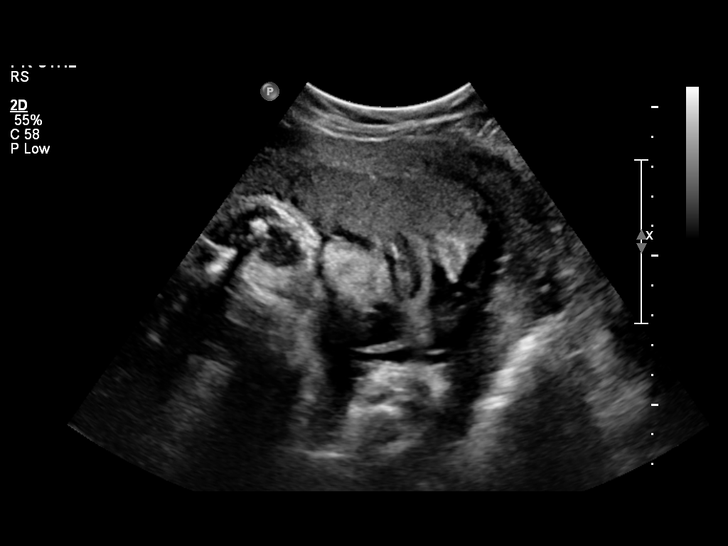

[12 of 28 positions shown; findings below may reference images not displayed]

OBSTETRICS REPORT
                      (Signed Final 11/26/2011 [DATE])

 Order#:         [id number]
Procedures

 US OB COMP + 14 WK                                    76805.1
Indications

 Assess Fetal Growth / Estimated Fetal Weight
 Diabetes - Gestational, A2
Fetal Evaluation

 Preg. Location:    Intrauterine
 Fetal Heart Rate:  149                         bpm
 Cardiac Activity:  Observed
 Presentation:      Cephalic
 Placenta:          Anterior, above cervical os
 P. Cord            Visualized
 Insertion:

 Amniotic Fluid
 AFI FV:      Subjectively within normal limits
 AFI Sum:     15.36   cm      57   %Tile     Larg Pckt:   5.77   cm
 RUQ:   5.73   cm    RLQ:    5.77   cm    LUQ:   1.46    cm   LLQ:    2.4    cm
Biometry

 BPD:     89.2  mm    G. Age:   36w 1d                CI:        74.55   70 - 86
                                                      FL/HC:      20.7   20.8 -

 HC:     327.9  mm    G. Age:   37w 2d       39  %    HC/AC:      1.05   0.92 -

 AC:       312  mm    G. Age:   35w 1d       23  %    FL/BPD:     76.2   71 - 87
 FL:        68  mm    G. Age:   34w 6d       12  %    FL/AC:      21.8   20 - 24
 HUM:     60.8  mm    G. Age:   35w 2d       42  %

 Est. FW:    8220  gm    5 lb 14 oz      39  %
Gestational Age

 Clinical EDD:  36w 4d                                        EDD:   12/20/11
 U/S Today:     35w 6d                                        EDD:   12/25/11
 Best:          36w 4d    Det. By:   Clinical EDD             EDD:   12/20/11
Anatomy

 Cranium:           Appears normal      Aortic Arch:       Basic anatomy
                                                           exam per order
 Fetal Cavum:       Not well            Ductal Arch:       Basic anatomy
                    visualized                             exam per order
 Ventricles:        Appears normal      Diaphragm:         Appears normal
 Choroid Plexus:    Not well            Stomach:           Appears
                    visualized                             normal, left
                                                           sided
 Cerebellum:        Not well            Abdomen:           Appears normal
                    visualized
 Posterior Fossa:   Appears normal      Abdominal Wall:    Appears nml
                                                           (cord insert,
                                                           abd wall)
 Nuchal Fold:       Not applicable      Cord Vessels:      Appears normal
                    (>20 wks GA)                           (3 vessel cord)
 Face:              Appears normal      Kidneys:           Appear normal
                    (lips/profile/orbit
                    s)
 Heart:             Appears normal      Bladder:           Appears normal
                    (4 chamber &
                    axis)
 RVOT:              Appears normal      Spine:             Appears normal
 LVOT:              Appears normal      Limbs:             Four extremities
                                                           seen

 Other:     Complete fetal anatomic survey previously performed.
            Fetus appears to be a female. Technically difficult due to
            advanced GA.
Cervix Uterus Adnexa

 Cervix:       Not well visualized (advanced GA >34 wks)
 Uterus:       No abnormality visualized.
 Left Ovary:   No adnexal mass visualized.
 Right Ovary:  No adnexal mass visualized.
Impression

 Assigned GA is currently 36w 4d by clinical EDD.
 Appropriate fetal growth, with EFW at 39 %ile.
 Suboptimal visualization of anatomy due to advanced GA,
 however no fetal anomalies identified.
 Amniotic fluid within normal limits, with AFI of 15.36 cm.

 questions or concerns.

## 2013-02-04 ENCOUNTER — Ambulatory Visit: Payer: Medicaid Other | Admitting: Obstetrics & Gynecology

## 2014-03-08 ENCOUNTER — Encounter (HOSPITAL_COMMUNITY): Payer: Self-pay | Admitting: *Deleted

## 2017-11-15 LAB — GLUCOSE, POCT (MANUAL RESULT ENTRY): POC Glucose: 133 mg/dl — AB (ref 70–99)

## 2018-05-19 ENCOUNTER — Other Ambulatory Visit: Payer: Self-pay

## 2018-05-19 ENCOUNTER — Encounter (HOSPITAL_COMMUNITY): Payer: Self-pay | Admitting: Family Medicine

## 2018-05-19 ENCOUNTER — Ambulatory Visit (HOSPITAL_COMMUNITY)
Admission: EM | Admit: 2018-05-19 | Discharge: 2018-05-19 | Disposition: A | Discharge status: Home / Self Care | Payer: Self-pay | Attending: Family Medicine | Admitting: Family Medicine

## 2018-05-19 DIAGNOSIS — N390 Urinary tract infection, site not specified: Secondary | ICD-10-CM | POA: Insufficient documentation

## 2018-05-19 DIAGNOSIS — Z3202 Encounter for pregnancy test, result negative: Secondary | ICD-10-CM

## 2018-05-19 LAB — POCT URINALYSIS DIP (DEVICE)
BILIRUBIN URINE: NEGATIVE
Glucose, UA: NEGATIVE mg/dL
Ketones, ur: NEGATIVE mg/dL
Nitrite: NEGATIVE
PH: 5.5 (ref 5.0–8.0)
Protein, ur: NEGATIVE mg/dL
SPECIFIC GRAVITY, URINE: 1.01 (ref 1.005–1.030)
UROBILINOGEN UA: 0.2 mg/dL (ref 0.0–1.0)

## 2018-05-19 LAB — POCT PREGNANCY, URINE: Preg Test, Ur: NEGATIVE

## 2018-05-19 MED ORDER — NITROFURANTOIN MONOHYD MACRO 100 MG PO CAPS: 100.0000 mg | ORAL_CAPSULE | Freq: Two times a day (BID) | ORAL | 0 refills | Status: DC

## 2018-05-19 NOTE — ED Triage Notes (Signed)
Pt cc stomach pain this started yesterday. Pt states she's voiding a lot.

## 2018-05-19 NOTE — ED Provider Notes (Signed)
[facility]    CSN: [id number] Arrival date & time: 05/19/18  0849     History   Chief Complaint Chief Complaint  Patient presents with  . Urinary Tract Infection    HPI Stanford Breedoura Wadsworth is a 38 y.o. female.   Patient is a 38 year old female that presents with generalized stomach discomfort and bloating that started yesterday.  She is also had some urinary frequency.  She does suffer from constipation.  She took a laxative yesterday morning.  She did have good bowel movement after taking the laxative.  She denies any associated dysuria, hematuria, back pain, nausea, vomiting, diarrhea.  No fever, chills, body aches. Patient's last menstrual period was 05/17/2018.  Denies any vaginal discharge, itching, irritation.   ROS per HPI      Past Medical History:  Diagnosis Date  . No pertinent past medical history     There are no active problems to display for this patient.   Past Surgical History:  Procedure Laterality Date  . NO PAST SURGERIES      OB History    Gravida  4   Para  3   Term  3   Preterm  0   AB  1   Living  3     SAB  0   TAB  1   Ectopic  0   Multiple  0   Live Births  1            Home Medications    Prior to Admission medications   Medication Sig Start Date End Date Taking? Authorizing Provider  cholecalciferol (VITAMIN D) 1000 UNITS tablet Take 1,000 Units by mouth daily.    [provider]  ibuprofen (ADVIL,MOTRIN) 600 MG tablet Take 1 tablet (600 mg total) by mouth every 6 (six) hours. 12/18/11 12/28/11  Brock BadHarper, Charles A, MD  nitrofurantoin, macrocrystal-monohydrate, (MACROBID) 100 MG capsule Take 1 capsule (100 mg total) by mouth 2 (two) times daily. 05/19/18   Janace ArisBast, Miles Borkowski A, NP  Prenatal Vit-Fe Fumarate-FA (PRENATAL MULTIVITAMIN) TABS Take 1 tablet by mouth daily.    [provider]    Family History Family History  Problem Relation Age of Onset  . Anesthesia problems Neg Hx     Social  History Social History   Tobacco Use  . Smoking status: Never Smoker  . Smokeless tobacco: Never Used  Substance Use Topics  . Alcohol use: No  . Drug use: No     Allergies   Patient has no known allergies.   Review of Systems Review of Systems   Physical Exam Triage Vital Signs ED Triage Vitals  Enc Vitals Group     BP 05/19/18 0919 104/76     Pulse Rate 05/19/18 0919 (!) 102     Resp 05/19/18 0919 16     Temp 05/19/18 0919 98.2 F (36.8 C)     Temp Source 05/19/18 0919 Oral     SpO2 05/19/18 0919 98 %     Weight 05/19/18 0917 140 lb (63.5 kg)     Height --      Head Circumference --      Peak Flow --      Pain Score 05/19/18 0917 8     Pain Loc --      Pain Edu? --      Excl. in GC? --    No data found.  Updated Vital Signs BP 104/76 (BP Location: Left Arm)   Pulse (!) 102  Temp 98.2 F (36.8 C) (Oral)   Resp 16   Wt 140 lb (63.5 kg)   LMP 05/17/2018   SpO2 98%   BMI 26.45 kg/m   Visual Acuity Right Eye Distance:   Left Eye Distance:   Bilateral Distance:    Right Eye Near:   Left Eye Near:    Bilateral Near:     Physical Exam Vitals signs and nursing note reviewed.  Constitutional:      General: She is not in acute distress.    Appearance: Normal appearance. She is not ill-appearing, toxic-appearing or diaphoretic.  HENT:     Head: Normocephalic and atraumatic.     Nose: Nose normal.  Eyes:     Conjunctiva/sclera: Conjunctivae normal.  Neck:     Musculoskeletal: Normal range of motion.  Pulmonary:     Effort: Pulmonary effort is normal.  Abdominal:     General: There is no distension.     Palpations: Abdomen is soft. There is no mass.     Tenderness: There is no right CVA tenderness, left CVA tenderness, guarding or rebound.     Comments: Generalized abdominal tenderness.  Musculoskeletal: Normal range of motion.  Skin:    General: Skin is warm and dry.     Findings: No rash.  Neurological:     Mental Status: She is alert.    Psychiatric:        Mood and Affect: Mood normal.      UC Treatments / Results  Labs (all labs ordered are listed, but only abnormal results are displayed) Labs Reviewed  POCT URINALYSIS DIP (DEVICE) - Abnormal; Notable for the following components:      Result Value   Hgb urine dipstick SMALL (*)    Leukocytes, UA TRACE (*)    All other components within normal limits  URINE CULTURE  POC URINE PREG, ED  POCT PREGNANCY, URINE    EKG None  Radiology No results found.  Procedures Procedures (including critical care time)  Medications Ordered in UC Medications - No data to display  Initial Impression / Assessment and Plan / UC Course  I have reviewed the triage vital signs and the nursing notes.  Pertinent labs & imaging results that were available during my care of the patient were reviewed by me and considered in my medical decision making (see chart for details).     Urine showed trace leuks and trace blood.  We will go ahead and treat for urinary tract infection pending culture. Macrobid twice a day for 5 days. Follow up as needed for continued or worsening symptoms Final Clinical Impressions(s) / UC Diagnoses   Final diagnoses:  Lower urinary tract infectious disease     Discharge Instructions     Your urine was positive for urinary tract infection We will go ahead and treat the infection and send specimen for culture Follow-up for continued or worsening symptoms.    ED Prescriptions    Medication Sig Dispense Auth. Provider   nitrofurantoin, macrocrystal-monohydrate, (MACROBID) 100 MG capsule Take 1 capsule (100 mg total) by mouth 2 (two) times daily. 10 capsule Dahlia Byes A, NP     Controlled Substance Prescriptions [place] Controlled Substance Registry consulted? Not Applicable   Janace Aris, NP 05/19/18 1036

## 2018-05-19 NOTE — Discharge Instructions (Signed)
Your urine was positive for urinary tract infection We will go ahead and treat the infection and send specimen for culture Follow-up for continued or worsening symptoms.

## 2018-05-20 LAB — URINE CULTURE: Culture: NO GROWTH

## 2019-12-15 ENCOUNTER — Telehealth: Payer: Self-pay

## 2019-12-15 NOTE — Telephone Encounter (Signed)
Attempted to contact patient regarding mammography scholarship application as message on application, stated "call me." Not able to leave message on voicemail, received message" voicemail has not been set up."

## 2019-12-30 ENCOUNTER — Other Ambulatory Visit: Payer: Self-pay | Admitting: Family Medicine

## 2019-12-30 DIAGNOSIS — Z1231 Encounter for screening mammogram for malignant neoplasm of breast: Secondary | ICD-10-CM

## 2020-01-05 ENCOUNTER — Inpatient Hospital Stay: Admission: RE | Admit: 2020-01-05 | Payer: Medicaid Other | Source: Ambulatory Visit

## 2020-05-11 DIAGNOSIS — Z1388 Encounter for screening for disorder due to exposure to contaminants: Secondary | ICD-10-CM | POA: Diagnosis not present

## 2020-05-11 DIAGNOSIS — Z01419 Encounter for gynecological examination (general) (routine) without abnormal findings: Secondary | ICD-10-CM | POA: Diagnosis not present

## 2020-05-11 DIAGNOSIS — B373 Candidiasis of vulva and vagina: Secondary | ICD-10-CM | POA: Diagnosis not present

## 2020-05-11 DIAGNOSIS — Z0389 Encounter for observation for other suspected diseases and conditions ruled out: Secondary | ICD-10-CM | POA: Diagnosis not present

## 2020-05-11 DIAGNOSIS — Z3009 Encounter for other general counseling and advice on contraception: Secondary | ICD-10-CM | POA: Diagnosis not present

## 2020-09-28 DIAGNOSIS — Z3200 Encounter for pregnancy test, result unknown: Secondary | ICD-10-CM | POA: Diagnosis not present

## 2020-09-28 DIAGNOSIS — Z30017 Encounter for initial prescription of implantable subdermal contraceptive: Secondary | ICD-10-CM | POA: Diagnosis not present

## 2021-01-17 DIAGNOSIS — Z113 Encounter for screening for infections with a predominantly sexual mode of transmission: Secondary | ICD-10-CM | POA: Diagnosis not present

## 2021-01-17 DIAGNOSIS — Z3046 Encounter for surveillance of implantable subdermal contraceptive: Secondary | ICD-10-CM | POA: Diagnosis not present

## 2021-02-01 DIAGNOSIS — Z3046 Encounter for surveillance of implantable subdermal contraceptive: Secondary | ICD-10-CM | POA: Diagnosis not present

## 2022-04-06 DIAGNOSIS — Z419 Encounter for procedure for purposes other than remedying health state, unspecified: Secondary | ICD-10-CM | POA: Diagnosis not present

## 2022-05-07 DIAGNOSIS — Z419 Encounter for procedure for purposes other than remedying health state, unspecified: Secondary | ICD-10-CM | POA: Diagnosis not present

## 2022-05-10 ENCOUNTER — Other Ambulatory Visit: Payer: Self-pay | Admitting: Family

## 2022-05-10 DIAGNOSIS — Z130 Encounter for screening for diseases of the blood and blood-forming organs and certain disorders involving the immune mechanism: Secondary | ICD-10-CM | POA: Diagnosis not present

## 2022-05-10 DIAGNOSIS — K219 Gastro-esophageal reflux disease without esophagitis: Secondary | ICD-10-CM | POA: Diagnosis not present

## 2022-05-10 DIAGNOSIS — E559 Vitamin D deficiency, unspecified: Secondary | ICD-10-CM | POA: Diagnosis not present

## 2022-05-10 DIAGNOSIS — Z114 Encounter for screening for human immunodeficiency virus [HIV]: Secondary | ICD-10-CM | POA: Diagnosis not present

## 2022-05-10 DIAGNOSIS — Z23 Encounter for immunization: Secondary | ICD-10-CM | POA: Diagnosis not present

## 2022-05-10 DIAGNOSIS — Z1239 Encounter for other screening for malignant neoplasm of breast: Secondary | ICD-10-CM

## 2022-05-10 DIAGNOSIS — Z113 Encounter for screening for infections with a predominantly sexual mode of transmission: Secondary | ICD-10-CM | POA: Diagnosis not present

## 2022-05-10 DIAGNOSIS — H6121 Impacted cerumen, right ear: Secondary | ICD-10-CM | POA: Diagnosis not present

## 2022-05-10 DIAGNOSIS — Z1329 Encounter for screening for other suspected endocrine disorder: Secondary | ICD-10-CM | POA: Diagnosis not present

## 2022-05-10 DIAGNOSIS — R7302 Impaired glucose tolerance (oral): Secondary | ICD-10-CM | POA: Diagnosis not present

## 2022-05-10 DIAGNOSIS — H60543 Acute eczematoid otitis externa, bilateral: Secondary | ICD-10-CM | POA: Diagnosis not present

## 2022-05-10 DIAGNOSIS — R350 Frequency of micturition: Secondary | ICD-10-CM | POA: Diagnosis not present

## 2022-05-10 DIAGNOSIS — Z0000 Encounter for general adult medical examination without abnormal findings: Secondary | ICD-10-CM | POA: Diagnosis not present

## 2022-05-10 DIAGNOSIS — Z13220 Encounter for screening for lipoid disorders: Secondary | ICD-10-CM | POA: Diagnosis not present

## 2022-05-10 DIAGNOSIS — Z1159 Encounter for screening for other viral diseases: Secondary | ICD-10-CM | POA: Diagnosis not present

## 2022-06-07 DIAGNOSIS — Z419 Encounter for procedure for purposes other than remedying health state, unspecified: Secondary | ICD-10-CM | POA: Diagnosis not present

## 2022-06-28 DIAGNOSIS — H60543 Acute eczematoid otitis externa, bilateral: Secondary | ICD-10-CM | POA: Diagnosis not present

## 2022-06-28 DIAGNOSIS — K5904 Chronic idiopathic constipation: Secondary | ICD-10-CM | POA: Diagnosis not present

## 2022-06-28 DIAGNOSIS — G514 Facial myokymia: Secondary | ICD-10-CM | POA: Diagnosis not present

## 2022-07-06 DIAGNOSIS — Z419 Encounter for procedure for purposes other than remedying health state, unspecified: Secondary | ICD-10-CM | POA: Diagnosis not present

## 2022-07-09 ENCOUNTER — Ambulatory Visit
Admission: RE | Admit: 2022-07-09 | Discharge: 2022-07-09 | Disposition: A | Discharge status: Home / Self Care | Payer: Medicaid Other | Source: Ambulatory Visit | Attending: Family | Admitting: Family

## 2022-07-09 DIAGNOSIS — Z1239 Encounter for other screening for malignant neoplasm of breast: Secondary | ICD-10-CM

## 2022-08-06 DIAGNOSIS — Z419 Encounter for procedure for purposes other than remedying health state, unspecified: Secondary | ICD-10-CM | POA: Diagnosis not present

## 2022-09-05 DIAGNOSIS — Z419 Encounter for procedure for purposes other than remedying health state, unspecified: Secondary | ICD-10-CM | POA: Diagnosis not present

## 2022-10-03 DIAGNOSIS — H6121 Impacted cerumen, right ear: Secondary | ICD-10-CM | POA: Diagnosis not present

## 2022-10-03 DIAGNOSIS — H60543 Acute eczematoid otitis externa, bilateral: Secondary | ICD-10-CM | POA: Diagnosis not present

## 2022-10-06 DIAGNOSIS — Z419 Encounter for procedure for purposes other than remedying health state, unspecified: Secondary | ICD-10-CM | POA: Diagnosis not present

## 2022-11-05 DIAGNOSIS — Z419 Encounter for procedure for purposes other than remedying health state, unspecified: Secondary | ICD-10-CM | POA: Diagnosis not present

## 2022-12-06 DIAGNOSIS — Z419 Encounter for procedure for purposes other than remedying health state, unspecified: Secondary | ICD-10-CM | POA: Diagnosis not present

## 2023-01-06 DIAGNOSIS — Z419 Encounter for procedure for purposes other than remedying health state, unspecified: Secondary | ICD-10-CM | POA: Diagnosis not present

## 2023-01-15 ENCOUNTER — Ambulatory Visit (INDEPENDENT_AMBULATORY_CARE_PROVIDER_SITE_OTHER): Payer: Medicaid Other | Admitting: Primary Care

## 2023-01-15 ENCOUNTER — Encounter (INDEPENDENT_AMBULATORY_CARE_PROVIDER_SITE_OTHER): Payer: Self-pay | Admitting: Primary Care

## 2023-01-15 VITALS — BP 136/87 | HR 85 | Resp 16 | Ht 61.5 in | Wt 144.4 lb

## 2023-01-15 DIAGNOSIS — Z1159 Encounter for screening for other viral diseases: Secondary | ICD-10-CM | POA: Diagnosis not present

## 2023-01-15 DIAGNOSIS — E663 Overweight: Secondary | ICD-10-CM

## 2023-01-15 DIAGNOSIS — K582 Mixed irritable bowel syndrome: Secondary | ICD-10-CM

## 2023-01-15 DIAGNOSIS — R7303 Prediabetes: Secondary | ICD-10-CM

## 2023-01-15 DIAGNOSIS — Z7689 Persons encountering health services in other specified circumstances: Secondary | ICD-10-CM

## 2023-01-15 DIAGNOSIS — F32A Depression, unspecified: Secondary | ICD-10-CM | POA: Diagnosis not present

## 2023-01-15 DIAGNOSIS — Z6826 Body mass index (BMI) 26.0-26.9, adult: Secondary | ICD-10-CM | POA: Diagnosis not present

## 2023-01-15 DIAGNOSIS — H6121 Impacted cerumen, right ear: Secondary | ICD-10-CM | POA: Diagnosis not present

## 2023-01-15 DIAGNOSIS — E782 Mixed hyperlipidemia: Secondary | ICD-10-CM | POA: Diagnosis not present

## 2023-01-15 NOTE — Progress Notes (Signed)
New Patient Office Visit  Subjective    Patient ID: Danielle Travis, female    DOB: 1980-12-30  Age: 42 y.o. MRN: [id number]  CC:  Chief Complaint  Patient presents with   New Patient (Initial Visit)   Irritable Bowel Syndrome    Bloating Constipation     HPI Danielle Travis is a 42 year old Arabic female presents to establish care. Psychologist, educational  )   Her main concern is  chronic IBS. Patient has No headache, No chest pain, No abdominal pain - No Nausea, No new weakness tingling or numbness, No Cough - shortness of breath . She is prediabetes - Denies polyuria, polydipsia, polyphasia or vision changes.  Does not check blood sugars at home. She does state her right ear itches.   Outpatient Encounter Medications as of 01/15/2023  Medication Sig   cholecalciferol (VITAMIN D) 1000 UNITS tablet Take 1,000 Units by mouth daily. (Patient not taking: Reported on 01/15/2023)   ibuprofen (ADVIL,MOTRIN) 600 MG tablet Take 1 tablet (600 mg total) by mouth every 6 (six) hours.   nitrofurantoin, macrocrystal-monohydrate, (MACROBID) 100 MG capsule Take 1 capsule (100 mg total) by mouth 2 (two) times daily. (Patient not taking: Reported on 01/15/2023)   Prenatal Vit-Fe Fumarate-FA (PRENATAL MULTIVITAMIN) TABS Take 1 tablet by mouth daily. (Patient not taking: Reported on 01/15/2023)   No facility-administered encounter medications on file as of 01/15/2023.    Past Medical History:  Diagnosis Date   No pertinent past medical history     Past Surgical History:  Procedure Laterality Date   NO PAST SURGERIES      Family History  Problem Relation Age of Onset   Diabetes Mother    Hypertension Father    Anesthesia problems Neg Hx     Social History   Socioeconomic History   Marital status: Married    Spouse name: Not on file   Number of children: Not on file   Years of education: Not on file   Highest education level: Not on file  Occupational History   Not on file  Tobacco Use    Smoking status: Never   Smokeless tobacco: Never  Vaping Use   Vaping status: Never Used  Substance and Sexual Activity   Alcohol use: No   Drug use: No   Sexual activity: Yes    Birth control/protection: None  Other Topics Concern   Not on file  Social History Narrative   Not on file   Social Determinants of Health   Financial Resource Strain: Not at Risk (05/10/2022)   Received from [place], [employer]    Financial Resource Strain: 1  Food Insecurity: Not at Risk (05/10/2022)   Received from [place], [place]   Food Insecurity    Food: 1  Transportation Needs: Not at Risk (05/10/2022)   Received from [place], [employer] Needs    Transportation: 1  Physical Activity: Insufficiently Active (01/15/2023)   Exercise Vital Sign    Days of Exercise per Week: 2 days    Minutes of Exercise per Session: 20 min  Stress: No Stress Concern Present (01/15/2023)   [employer] of Occupational Health - Occupational Stress Questionnaire    Feeling of Stress : Not at all  Social Connections: Not on File (08/24/2021)   Received from [place], [place]   Social Connections    Social Connections and Isolation: 0  Intimate Partner Violence: Not on file    ROS Comprehensive ROS Pertinent  positive and negative noted in HPI       Objective    BP 136/87 (BP Location: Left Arm, Patient Position: Sitting, Cuff Size: Normal)   Pulse 85   Resp 16   Ht 5' 1.5" (1.562 m)   Wt 144 lb 6.4 oz (65.5 kg)   LMP 12/23/2022   SpO2 97%   BMI 26.84 kg/m   General: No apparent distress. Over weight Eyes: Extraocular eye movements intact, pupils equal and round. Right ear cerumen impaction Neck: Supple, trachea midline. Thyroid: No enlargement, mobile without fixation, no tenderness. Cardiovascular: Regular rhythm and rate, no murmur, normal radial pulses. Respiratory: Normal respiratory effort, clear to auscultation. Gastrointestinal: Normal pitch active bowel sounds,  nontender abdomen without distention or appreciable hepatomegaly. Musculoskeletal: Normal muscle tone, no tenderness on palpation of tibia, no excessive thoracic kyphosis. Skin: Appropriate warmth, no visible rash. Mental status: Alert, conversant, speech clear, thought logical, appropriate mood and affect, no hallucinations or delusions evident. Hematologic/lymphatic: No cervical adenopathy, no visible ecchymoses.     Assessment & Plan:  Veva was seen today for new patient (initial visit) and irritable bowel syndrome.  Diagnoses and all orders for this visit:  Encounter to establish care  Depression, unspecified depression type Flowsheet Row Office Visit from 01/15/2023 in [facility]  PHQ-9 Total Score 13      Refer to clinical social worker   Over weight Discussed diet and exercise for person with BMI >25. Instructed: You must burn more calories than you eat. Losing 5 percent of your body weight should be considered a success. In the longer term, losing more than 15 percent of your body weight and staying at this weight is an extremely good result.   Irritable bowel syndrome with both constipation and diarrhea Refer to GI  Mixed hyperlipidemia Check lipids   Prediabetes Last reading 5.9   Encounter for HCV screening test for low risk patient HCV screening  Impacted cerumen of right ear  Schedule ear lavage    No follow-ups on file.   Grayce Sessions, NP

## 2023-01-18 ENCOUNTER — Ambulatory Visit (INDEPENDENT_AMBULATORY_CARE_PROVIDER_SITE_OTHER): Payer: Medicaid Other

## 2023-01-18 LAB — CBC WITH DIFFERENTIAL/PLATELET
Basophils Absolute: 0 10*3/uL (ref 0.0–0.2)
Basos: 1 %
EOS (ABSOLUTE): 0.1 10*3/uL (ref 0.0–0.4)
Eos: 1 %
Hematocrit: 44.5 % (ref 34.0–46.6)
Hemoglobin: 13.9 g/dL (ref 11.1–15.9)
Immature Grans (Abs): 0 10*3/uL (ref 0.0–0.1)
Immature Granulocytes: 0 %
Lymphocytes Absolute: 2.6 10*3/uL (ref 0.7–3.1)
Lymphs: 37 %
MCH: 29.3 pg (ref 26.6–33.0)
MCHC: 31.2 g/dL — ABNORMAL LOW (ref 31.5–35.7)
MCV: 94 fL (ref 79–97)
Monocytes Absolute: 0.5 10*3/uL (ref 0.1–0.9)
Monocytes: 7 %
Neutrophils Absolute: 3.7 10*3/uL (ref 1.4–7.0)
Neutrophils: 54 %
Platelets: 277 10*3/uL (ref 150–450)
RBC: 4.74 x10E6/uL (ref 3.77–5.28)
RDW: 13.4 % (ref 11.7–15.4)
WBC: 6.9 10*3/uL (ref 3.4–10.8)

## 2023-01-18 LAB — LIPID PANEL
Chol/HDL Ratio: 5.2 ratio — ABNORMAL HIGH (ref 0.0–4.4)
Cholesterol, Total: 217 mg/dL — ABNORMAL HIGH (ref 100–199)
HDL: 42 mg/dL (ref >39)
LDL Chol Calc (NIH): 148 mg/dL — ABNORMAL HIGH (ref 0–99)
Triglycerides: 151 mg/dL — ABNORMAL HIGH (ref 0–149)
VLDL Cholesterol Cal: 27 mg/dL (ref 5–40)

## 2023-01-18 LAB — CMP14+EGFR
ALT: 9 IU/L (ref 0–32)
AST: 11 IU/L (ref 0–40)
Albumin: 4.5 g/dL (ref 3.9–4.9)
Alkaline Phosphatase: 76 IU/L (ref 44–121)
BUN/Creatinine Ratio: 19 (ref 9–23)
BUN: 11 mg/dL (ref 6–24)
Bilirubin Total: 0.3 mg/dL (ref 0.0–1.2)
CO2: 18 mmol/L — ABNORMAL LOW (ref 20–29)
Calcium: 9.2 mg/dL (ref 8.7–10.2)
Chloride: 104 mmol/L (ref 96–106)
Creatinine, Ser: 0.59 mg/dL (ref 0.57–1.00)
Globulin, Total: 3.1 g/dL (ref 1.5–4.5)
Glucose: 84 mg/dL (ref 70–99)
Potassium: 4.8 mmol/L (ref 3.5–5.2)
Sodium: 140 mmol/L (ref 134–144)
Total Protein: 7.6 g/dL (ref 6.0–8.5)
eGFR: 114 mL/min/{1.73_m2} (ref >59)

## 2023-01-18 LAB — HCV INTERPRETATION

## 2023-01-18 LAB — HCV AB W REFLEX TO QUANT PCR: HCV Ab: NONREACTIVE

## 2023-01-18 LAB — HEMOGLOBIN A1C
Est. average glucose Bld gHb Est-mCnc: 131 mg/dL
Hgb A1c MFr Bld: 6.2 % — ABNORMAL HIGH (ref 4.8–5.6)

## 2023-01-24 ENCOUNTER — Encounter: Payer: Self-pay | Admitting: Internal Medicine

## 2023-01-25 ENCOUNTER — Telehealth (INDEPENDENT_AMBULATORY_CARE_PROVIDER_SITE_OTHER): Payer: Self-pay | Admitting: Licensed Clinical Social Worker

## 2023-01-25 ENCOUNTER — Ambulatory Visit (INDEPENDENT_AMBULATORY_CARE_PROVIDER_SITE_OTHER): Payer: Medicaid Other

## 2023-01-25 ENCOUNTER — Telehealth (INDEPENDENT_AMBULATORY_CARE_PROVIDER_SITE_OTHER): Payer: Self-pay | Admitting: Primary Care

## 2023-01-25 MED ORDER — FLUOCINOLONE ACETONIDE 0.01 % OT OIL: TOPICAL_OIL | OTIC | 1 refills | Status: DC

## 2023-01-25 NOTE — Telephone Encounter (Signed)
Pharmacy calling b/c the Fluocinolone Acetonide 0.01 % OIL needs a PA.  The pharmacy said they sent this back in Feb, and the dr keeps sending in this same Rx and it is not covered by her insurance. They ask you send something else or try to get a prior auth

## 2023-01-25 NOTE — Telephone Encounter (Signed)
LCSWA called patient today to introduce herself and to assess patients' mental health needs. Patient did not answer the phone. LCSWA was able to leave a brief message with the patient asking them to return the call. Patient was referred by PCP for depression. Called using interpretor [phone]

## 2023-01-28 ENCOUNTER — Other Ambulatory Visit (INDEPENDENT_AMBULATORY_CARE_PROVIDER_SITE_OTHER): Payer: Self-pay | Admitting: Primary Care

## 2023-02-01 ENCOUNTER — Other Ambulatory Visit (INDEPENDENT_AMBULATORY_CARE_PROVIDER_SITE_OTHER): Payer: Self-pay | Admitting: Primary Care

## 2023-02-01 ENCOUNTER — Other Ambulatory Visit (INDEPENDENT_AMBULATORY_CARE_PROVIDER_SITE_OTHER): Payer: Self-pay

## 2023-02-01 MED ORDER — DEBROX 6.5 % OT SOLN: 5.0000 [drp] | OTIC | 0 refills | Status: DC | PRN

## 2023-02-01 NOTE — Telephone Encounter (Signed)
Rx has been sent  

## 2023-02-01 NOTE — Telephone Encounter (Signed)
Please send rx for debrox please

## 2023-02-05 ENCOUNTER — Other Ambulatory Visit: Payer: Self-pay

## 2023-02-05 DIAGNOSIS — Z419 Encounter for procedure for purposes other than remedying health state, unspecified: Secondary | ICD-10-CM | POA: Diagnosis not present

## 2023-02-26 ENCOUNTER — Encounter (INDEPENDENT_AMBULATORY_CARE_PROVIDER_SITE_OTHER): Payer: Self-pay | Admitting: Primary Care

## 2023-02-26 ENCOUNTER — Ambulatory Visit (INDEPENDENT_AMBULATORY_CARE_PROVIDER_SITE_OTHER): Payer: Medicaid Other | Admitting: Primary Care

## 2023-02-26 ENCOUNTER — Other Ambulatory Visit (HOSPITAL_COMMUNITY)
Admission: RE | Admit: 2023-02-26 | Discharge: 2023-02-26 | Disposition: A | Discharge status: Home / Self Care | Payer: Medicaid Other | Source: Ambulatory Visit | Attending: Primary Care | Admitting: Primary Care

## 2023-02-26 VITALS — BP 122/89 | HR 91 | Resp 16 | Wt 147.2 lb

## 2023-02-26 DIAGNOSIS — R300 Dysuria: Secondary | ICD-10-CM | POA: Diagnosis not present

## 2023-02-26 DIAGNOSIS — Z2821 Immunization not carried out because of patient refusal: Secondary | ICD-10-CM | POA: Diagnosis not present

## 2023-02-26 DIAGNOSIS — Z124 Encounter for screening for malignant neoplasm of cervix: Secondary | ICD-10-CM

## 2023-02-26 DIAGNOSIS — E782 Mixed hyperlipidemia: Secondary | ICD-10-CM | POA: Diagnosis not present

## 2023-02-26 LAB — POCT URINALYSIS DIP (CLINITEK)
Bilirubin, UA: NEGATIVE
Glucose, UA: NEGATIVE mg/dL
Ketones, POC UA: NEGATIVE mg/dL
Leukocytes, UA: NEGATIVE
Nitrite, UA: NEGATIVE
POC PROTEIN,UA: NEGATIVE
Spec Grav, UA: <=1.005 — AB (ref 1.010–1.025)
Urobilinogen, UA: 0.2 U/dL
pH, UA: 6 (ref 5.0–8.0)

## 2023-02-26 MED ORDER — ATORVASTATIN CALCIUM 20 MG PO TABS
20.0000 mg | ORAL_TABLET | Freq: Every day | ORAL | 1 refills | Status: DC
Start: 2023-02-26 — End: 2024-03-02

## 2023-02-26 NOTE — Patient Instructions (Signed)

## 2023-02-26 NOTE — Progress Notes (Signed)
  [facility]  WELL-WOMAN PHYSICAL & PAP Patient name: Danielle Travis MRN [id number]  Date of birth: 22-Apr-1981 Chief Complaint:   Gynecologic Exam  History of Present Illness:   Danielle Travis is an Arabic  42 y.o. [phone] female being seen today for a routine well-woman exam. Husband present at appt permission given by patient and interpreted.   CC:gyn   The current method of family planning is none.  LMP 02/06/23  Health Maintenance  Topic Date Due   Cervical Cancer Screening (HPV/Pap Cotest)  12/26/2013   COVID-19 Vaccine (4 - 2023-24 season) 01/06/2023   INFLUENZA VACCINE  08/05/2023 (Originally 12/06/2022)   DTaP/Tdap/Td (2 - Td or Tdap) 02/26/2024 (Originally 12/16/2021)   Hepatitis C Screening  Completed   HIV Screening  Completed   HPV VACCINES  Aged Out    Review of Systems:    Denies any headaches, blurred vision, fatigue, shortness of breath, chest pain, abdominal pain, abnormal vaginal discharge/itching/odor/irritation, problems with periods, bowel movements, urination, or intercourse unless otherwise stated above.  Pertinent History Reviewed:   Reviewed past medical,surgical, social and family history.  Reviewed problem list, medications and allergies.  Physical Assessment:   Vitals:   02/26/23 1021  BP: 122/89  Pulse: 91  Resp: 16  SpO2: 99%  Weight: 147 lb 3.2 oz (66.8 kg)  Body mass index is 27.36 kg/m.        Physical Examination:  General appearance - well appearing, and in no distress Mental status - alert, oriented to person, place, and time Psych:  She has a normal mood and affect Skin - warm and dry, normal color, no suspicious lesions noted Chest - effort normal, all lung fields clear to auscultation bilaterally Heart - normal rate and regular rhythm Neck:  midline trachea, no thyromegaly or nodules Breasts - breasts appear normal, no suspicious masses, no skin or nipple changes or axillary nodes Educated patient on proper self  breast examination and had patient to demonstrate SBE. Abdomen - soft, nontender, nondistended, no masses or organomegaly Pelvic-VULVA: normal appearing vulva with no masses, tenderness or lesions   VAGINA: normal appearing vagina with normal color and discharge, no lesions   CERVIX: normal appearing cervix without discharge or lesions, no CMT UTERUS: uterus is felt to be normal size, shape, consistency and nontender  ADNEXA: No adnexal masses or tenderness noted. Extremities:  No swelling or varicosities noted  No results found for this or any previous visit (from the past 24 hour(s)).   Assessment & Plan:  Danielle Travis was seen today for gynecologic exam.  Diagnoses and all orders for this visit:  Tetanus, diphtheria, and acellular pertussis (Tdap) vaccination declined  Cervical cancer screening -     Cytology - PAP -     Cervicovaginal ancillary only  Dysuria -     POCT URINALYSIS DIP (CLINITEK)    Mammogram 3/24 completed normal  Mixed hyperlipidemia -     Lipid panel; Future  Other orders -     atorvastatin (LIPITOR) 20 MG tablet; Take 1 tablet (20 mg total) by mouth daily.      This note has been created with Education officer, environmental. Any transcriptional errors are unintentional.   Grayce Sessions, NP 02/26/2023, 10:54 AM

## 2023-02-28 LAB — CERVICOVAGINAL ANCILLARY ONLY
Bacterial Vaginitis (gardnerella): NEGATIVE
Candida Glabrata: NEGATIVE
Candida Vaginitis: NEGATIVE
Chlamydia: NEGATIVE
Comment: NEGATIVE
Comment: NEGATIVE
Comment: NEGATIVE
Comment: NEGATIVE
Comment: NEGATIVE
Comment: NORMAL
Neisseria Gonorrhea: NEGATIVE
Trichomonas: NEGATIVE

## 2023-03-04 LAB — CYTOLOGY - PAP: Diagnosis: NEGATIVE

## 2023-03-08 DIAGNOSIS — Z419 Encounter for procedure for purposes other than remedying health state, unspecified: Secondary | ICD-10-CM | POA: Diagnosis not present

## 2023-04-01 ENCOUNTER — Encounter (INDEPENDENT_AMBULATORY_CARE_PROVIDER_SITE_OTHER): Payer: Self-pay | Admitting: Primary Care

## 2023-04-07 DIAGNOSIS — Z419 Encounter for procedure for purposes other than remedying health state, unspecified: Secondary | ICD-10-CM | POA: Diagnosis not present

## 2023-05-08 DIAGNOSIS — Z419 Encounter for procedure for purposes other than remedying health state, unspecified: Secondary | ICD-10-CM | POA: Diagnosis not present

## 2023-05-09 ENCOUNTER — Ambulatory Visit: Payer: Medicaid Other | Admitting: Internal Medicine

## 2023-05-09 VITALS — BP 108/66 | HR 97 | Ht 62.0 in | Wt 136.0 lb

## 2023-05-09 DIAGNOSIS — R140 Abdominal distension (gaseous): Secondary | ICD-10-CM | POA: Diagnosis not present

## 2023-05-09 DIAGNOSIS — K6289 Other specified diseases of anus and rectum: Secondary | ICD-10-CM | POA: Diagnosis not present

## 2023-05-09 DIAGNOSIS — R1030 Lower abdominal pain, unspecified: Secondary | ICD-10-CM | POA: Diagnosis not present

## 2023-05-09 DIAGNOSIS — K5900 Constipation, unspecified: Secondary | ICD-10-CM

## 2023-05-09 DIAGNOSIS — Z1211 Encounter for screening for malignant neoplasm of colon: Secondary | ICD-10-CM

## 2023-05-09 MED ORDER — NA SULFATE-K SULFATE-MG SULF 17.5-3.13-1.6 GM/177ML PO SOLN: ORAL | 0 refills | Status: DC

## 2023-05-09 MED ORDER — LINACLOTIDE 145 MCG PO CAPS: 145.0000 ug | ORAL_CAPSULE | Freq: Every day | ORAL | 3 refills | Status: DC

## 2023-05-09 NOTE — Patient Instructions (Addendum)
 Drink 8 cups of water a day and walk 30 minutes a day.  Please purchase the following medications over the counter and take as directed: Fiber supplement such as Benefiber- use as directed daily  You have been scheduled for a colonoscopy. Please follow written instructions given to you at your visit today.   Please pick up your prep supplies at the pharmacy within the next 1-3 days.  If you use inhalers (even only as needed), please bring them with you on the day of your procedure.  DO NOT TAKE 7 DAYS PRIOR TO TEST- Trulicity (dulaglutide) Ozempic, Wegovy (semaglutide) Mounjaro (tirzepatide) Bydureon Bcise (exanatide extended release)  DO NOT TAKE 1 DAY PRIOR TO YOUR TEST Rybelsus (semaglutide) Adlyxin (lixisenatide) Victoza (liraglutide) Byetta (exanatide) ___________________________________________________________________________  We have sent the following medications to your pharmacy for you to pick up at your convenience: Suprep  If your blood pressure at your visit was 140/90 or greater, please contact your primary care physician to follow up on this.  _______________________________________________________  If you are age 47 or older, your body mass index should be between 23-30. Your Body mass index is 24.87 kg/m. If this is out of the aforementioned range listed, please consider follow up with your Primary Care Provider.  If you are age 39 or younger, your body mass index should be between 19-25. Your Body mass index is 24.87 kg/m. If this is out of the aformentioned range listed, please consider follow up with your Primary Care Provider.   ________________________________________________________  The [facility] providers would like to encourage you to use MYCHART to communicate with providers for non-urgent requests or questions.  Due to long hold times on the telephone, sending your provider a message by [facility] may be a faster and more efficient way to get a  response.  Please allow 48 business hours for a response.  Please remember that this is for non-urgent requests.  _______________________________________________________   Thank you for entrusting me with your care and for choosing [facility], Dr. [name]

## 2023-05-09 NOTE — Progress Notes (Signed)
 Chief Complaint: Constipation  HPI : 43 year old female with IBS presents with constipation  Patient presents with her husband and an Arabic interpreter.  She has had constipation for a long time, but this has been getting worse over time. She has to use laxatives every 2 days in order to have a BM. The laxatives she is using include Miralax and Dulcolax. She does eat a variety of foods. She is having some ab pain located in the middle of her abdomen that then goes to the sides. Pain will sometimes go all the way to right side of her abdomen. Having a BM will help with the pain. She will use ibuprofen  for ab pain at times, which seems to help. Eating too much food can make things worse and not eating can also make the pain worse. Denies rectal bleeding. Endorses some weight loss deliberately. Endorses some bloating. Denies N&V, dysphagia, chest burning, or regurgitation. Denies family history of GI issues. Endorses rectal pain. Denies prior EGD or colonoscopy.  Wt Readings from Last 3 Encounters:  05/09/23 136 lb (61.7 kg)  02/26/23 147 lb 3.2 oz (66.8 kg)  01/15/23 144 lb 6.4 oz (65.5 kg)   Past Medical History:  Diagnosis Date   No pertinent past medical history      Past Surgical History:  Procedure Laterality Date   NO PAST SURGERIES     Family History  Problem Relation Age of Onset   Diabetes Mother    Hypertension Father    Anesthesia problems Neg Hx    Social History   Tobacco Use   Smoking status: Never   Smokeless tobacco: Never  Vaping Use   Vaping status: Never Used  Substance Use Topics   Alcohol use: No   Drug use: No   Current Outpatient Medications  Medication Sig Dispense Refill   atorvastatin  (LIPITOR) 20 MG tablet Take 1 tablet (20 mg total) by mouth daily. 90 tablet 1   linaclotide  (LINZESS ) 145 MCG CAPS capsule Take 1 capsule (145 mcg total) by mouth daily before breakfast. 30 capsule 3   Na Sulfate-K Sulfate-Mg Sulf 17.5-3.13-1.6 GM/177ML SOLN Use as  directed; may use generic; goodrx card if insurance will not cover generic 354 mL 0   bisacodyl (DULCOLAX) 5 MG EC tablet Take 15 mg by mouth daily as needed.     carbamide peroxide (DEBROX) 6.5 % OTIC solution Place 5 drops into both ears as needed. (Patient not taking: Reported on 05/09/2023) 15 mL 0   cholecalciferol (VITAMIN D) 1000 UNITS tablet Take 1,000 Units by mouth daily. (Patient not taking: Reported on 01/15/2023)     ibuprofen  (ADVIL ,MOTRIN ) 600 MG tablet Take 1 tablet (600 mg total) by mouth every 6 (six) hours. 30 tablet 5   nitrofurantoin , macrocrystal-monohydrate, (MACROBID ) 100 MG capsule Take 1 capsule (100 mg total) by mouth 2 (two) times daily. (Patient not taking: Reported on 05/09/2023) 10 capsule 0   Prenatal Vit-Fe Fumarate-FA (PRENATAL MULTIVITAMIN) TABS Take 1 tablet by mouth daily. (Patient not taking: Reported on 01/15/2023)     No current facility-administered medications for this visit.   No Known Allergies  Review of Systems: All systems reviewed and negative except where noted in HPI.   Physical Exam: BP 108/66   Pulse 97   Ht 5' 2 (1.575 m)   Wt 136 lb (61.7 kg)   LMP 04/24/2023 (Exact Date)   SpO2 99%   BMI 24.87 kg/m  Constitutional: Pleasant,well-developed, female in no acute distress. HEENT: Normocephalic  and atraumatic. Conjunctivae are normal. No scleral icterus. Cardiovascular: Normal rate, regular rhythm.  Pulmonary/chest: Effort normal and breath sounds normal. No wheezing, rales or rhonchi. Abdominal: Soft, nondistended, mildly tender in the lower abdomen. Bowel sounds active throughout. There are no masses palpable. No hepatomegaly. Extremities: No edema Neurological: Alert and oriented to person place and time. Skin: Skin is warm and dry. No rashes noted. Psychiatric: Normal mood and affect. Behavior is normal.  Labs 05/2022: TSH nml. Vit D nml. HbA1C 5.9%. HIV NR. HCV NR.   Labs 01/2023: CBC unremarkable. CMP unremarkable.   ASSESSMENT AND  PLAN: Constipation Abdominal pain in the lower abdomen, sometimes in the right abdomen Bloating Rectal pain Colon cancer screening Patient presents with longstanding constipation has worsened over time.  She has noted some abdominal pain mostly in the lower abdomen, which can sometimes travel to the right side.  She also describes bloating and rectal pain.  Her abdominal pain, bloating, and rectal pain could be due to uncontrolled constipation.  Thus we will institute some constipation therapies.  Since patient will need a colonoscopy for colon cancer screening anyways, I discussed the risk and benefits of the colonoscopy procedure with the patient.  Patient is agreeable to getting the colonoscopy scheduled.  If patient's symptoms do not resolve, could consider further workup for her abdominal pain and bloating. - Drink 8 cups of water per day, walk 30 min per day, daily fiber supplement - Avoid NSAIDS if possible - Start Linzess  145 mcg every day - Colonoscopy [facility] with 2 day prep   [name], MD  I spent 50 minutes of time, including in depth chart review, independent review of results as outlined above, communicating results with the patient directly, face-to-face time with the patient, coordinating care, ordering studies and medications as appropriate, and documentation.

## 2023-05-29 ENCOUNTER — Encounter (INDEPENDENT_AMBULATORY_CARE_PROVIDER_SITE_OTHER): Payer: Self-pay | Admitting: Primary Care

## 2023-05-29 ENCOUNTER — Ambulatory Visit (INDEPENDENT_AMBULATORY_CARE_PROVIDER_SITE_OTHER): Payer: Medicaid Other | Admitting: Primary Care

## 2023-05-29 VITALS — BP 125/85 | HR 88 | Resp 16 | Ht 61.5 in | Wt 135.0 lb

## 2023-05-29 DIAGNOSIS — N921 Excessive and frequent menstruation with irregular cycle: Secondary | ICD-10-CM | POA: Diagnosis not present

## 2023-05-29 DIAGNOSIS — E782 Mixed hyperlipidemia: Secondary | ICD-10-CM | POA: Diagnosis not present

## 2023-05-29 NOTE — Patient Instructions (Signed)
28

## 2023-05-29 NOTE — Progress Notes (Unsigned)
  [facility]  Danielle Travis, is a 43 y.o. female  NUU:[id number]  HKV:[id number]  DOB - 1980/10/29  Chief Complaint  Patient presents with   Menstrual Problem    Period started Jan 8-Jan14 stopped for 3 days and came back and is still currently on        Subjective:   Danielle Travis is a 43 y.o. female here today for a follow up visit. Patient has No headache, No chest pain, No abdominal pain - No Nausea, No new weakness tingling or numbness, No Cough - shortness of breath HPI  No problems updated.  Comprehensive ROS Pertinent positive and negative noted in HPI   No Known Allergies  Past Medical History:  Diagnosis Date   No pertinent past medical history     Current Outpatient Medications on File Prior to Visit  Medication Sig Dispense Refill   atorvastatin (LIPITOR) 20 MG tablet Take 1 tablet (20 mg total) by mouth daily. 90 tablet 1   bisacodyl (DULCOLAX) 5 MG EC tablet Take 15 mg by mouth daily as needed.     linaclotide (LINZESS) 145 MCG CAPS capsule Take 1 capsule (145 mcg total) by mouth daily before breakfast. 30 capsule 3   Na Sulfate-K Sulfate-Mg Sulf 17.5-3.13-1.6 GM/177ML SOLN Use as directed; may use generic; goodrx card if insurance will not cover generic 354 mL 0   No current facility-administered medications on file prior to visit.   Health Maintenance  Topic Date Due   COVID-19 Vaccine (4 - 2024-25 season) 01/06/2023   Flu Shot  08/05/2023*   DTaP/Tdap/Td vaccine (2 - Td or Tdap) 02/26/2024*   Pap with HPV screening  02/25/2026   Hepatitis C Screening  Completed   HIV Screening  Completed   HPV Vaccine  Aged Out  *Topic was postponed. The date shown is not the original due date.    Objective:   Vitals:   05/29/23 1024  BP: 125/85  Pulse: 88  Resp: 16  SpO2: 99%  Weight: 135 lb (61.2 kg)  Height: 5' 1.5" (1.562 m)   {Vitals History (Optional):23777}  Physical Exam  {Labs (Optional):23779}  Assessment & Plan   Menorrhagia with irregular cycle -     CBC with Differential/Platelet  Mixed hyperlipidemia -     Lipid panel     Patient have been counseled extensively about nutrition and exercise. Other issues discussed during this visit include: low cholesterol diet, weight control and daily exercise, foot care, annual eye examinations at Ophthalmology, importance of adherence with medications and regular follow-up. We also discussed long term complications of uncontrolled diabetes and hypertension.   Return in about 6 months (around 11/26/2023).  The patient was given clear instructions to go to ER or return to medical center if symptoms don't improve, worsen or new problems develop. The patient verbalized understanding. The patient was told to call to get lab results if they haven't heard anything in the next week.   This note has been created with Education officer, environmental. Any transcriptional errors are unintentional.   Grayce Sessions, NP 05/29/2023, 11:09 AM

## 2023-05-30 LAB — LIPID PANEL
Chol/HDL Ratio: 3.3 {ratio} (ref 0.0–4.4)
Cholesterol, Total: 136 mg/dL (ref 100–199)
HDL: 41 mg/dL (ref >39)
LDL Chol Calc (NIH): 77 mg/dL (ref 0–99)
Triglycerides: 93 mg/dL (ref 0–149)
VLDL Cholesterol Cal: 18 mg/dL (ref 5–40)

## 2023-06-08 DIAGNOSIS — Z419 Encounter for procedure for purposes other than remedying health state, unspecified: Secondary | ICD-10-CM | POA: Diagnosis not present

## 2023-06-13 ENCOUNTER — Encounter: Payer: Medicaid Other | Admitting: Internal Medicine

## 2023-07-02 ENCOUNTER — Ambulatory Visit (AMBULATORY_SURGERY_CENTER): Payer: Medicaid Other | Admitting: Internal Medicine

## 2023-07-02 ENCOUNTER — Encounter: Payer: Self-pay | Admitting: Internal Medicine

## 2023-07-02 VITALS — BP 116/53 | HR 85 | Temp 98.8°F | Resp 10 | Ht 62.0 in | Wt 136.0 lb

## 2023-07-02 DIAGNOSIS — K648 Other hemorrhoids: Secondary | ICD-10-CM

## 2023-07-02 DIAGNOSIS — D125 Benign neoplasm of sigmoid colon: Secondary | ICD-10-CM

## 2023-07-02 DIAGNOSIS — Z1211 Encounter for screening for malignant neoplasm of colon: Secondary | ICD-10-CM

## 2023-07-02 MED ORDER — SODIUM CHLORIDE 0.9 % IV SOLN: 500.0000 mL | Freq: Once | INTRAVENOUS | Status: DC

## 2023-07-02 NOTE — Progress Notes (Signed)
 Called to room to assist during endoscopic procedure.  Patient ID and intended procedure confirmed with present staff. Received instructions for my participation in the procedure from the performing physician.

## 2023-07-02 NOTE — Op Note (Signed)
 [facility] Patient Name: Danielle Travis Procedure Date: 07/02/2023 11:14 AM MRN: [id number] Endoscopist: Particia Lather , , [id number] Age: 43 Referring MD:  Date of Birth: 30-Aug-1980 Gender: Female Account #: [id number] Procedure:                Colonoscopy Indications:              Screening for colorectal malignant neoplasm, This                            is the patient's first colonoscopy Medicines:                Monitored Anesthesia Care Procedure:                Pre-Anesthesia Assessment:                           - Prior to the procedure, a History and Physical                            was performed, and patient medications and                            allergies were reviewed. The patient's tolerance of                            previous anesthesia was also reviewed. The risks                            and benefits of the procedure and the sedation                            options and risks were discussed with the patient.                            All questions were answered, and informed consent                            was obtained. Prior Anticoagulants: The patient has                            taken no anticoagulant or antiplatelet agents. ASA                            Grade Assessment: I - A normal, healthy patient.                            After reviewing the risks and benefits, the patient                            was deemed in satisfactory condition to undergo the                            procedure.  After obtaining informed consent, the colonoscope                            was passed under direct vision. Throughout the                            procedure, the patient's blood pressure, pulse, and                            oxygen saturations were monitored continuously. The                            PCF-HQ190L Colonoscope [id number] was introduced                            through the anus and advanced to  the the terminal                            ileum. The colonoscopy was performed without                            difficulty. The patient tolerated the procedure                            well. The quality of the bowel preparation was                            excellent. The terminal ileum, ileocecal valve,                            appendiceal orifice, and rectum were photographed. Scope In: 11:26:47 AM Scope Out: 11:43:01 AM Scope Withdrawal Time: 0 hours 10 minutes 5 seconds  Total Procedure Duration: 0 hours 16 minutes 14 seconds  Findings:                 The terminal ileum appeared normal.                           A 3 mm polyp was found in the sigmoid colon. The                            polyp was sessile. The polyp was removed with a                            cold snare. Resection and retrieval were complete.                           Non-bleeding internal hemorrhoids were found during                            retroflexion. Complications:            No immediate complications. Estimated Blood Loss:     Estimated blood loss was minimal. Impression:               -  The examined portion of the ileum was normal.                           - One 3 mm polyp in the sigmoid colon, removed with                            a cold snare. Resected and retrieved.                           - Non-bleeding internal hemorrhoids. Recommendation:           - Discharge patient to home (with escort).                           - Await pathology results.                           - The findings and recommendations were discussed                            with the patient. Dr Particia Lather "Adair" Walford,  07/02/2023 11:46:28 AM

## 2023-07-02 NOTE — Patient Instructions (Signed)

## 2023-07-02 NOTE — Progress Notes (Signed)
 Sedate, gd SR, tolerated procedure well, VSS, report to RN

## 2023-07-02 NOTE — Progress Notes (Signed)
 GASTROENTEROLOGY PROCEDURE H&P NOTE   Primary Care Physician: Grayce Sessions, NP    Reason for Procedure:   Colon cancer screening  Plan:    Colonoscopy  Patient is appropriate for endoscopic procedure(s) in the ambulatory ([facility]) setting.  The nature of the procedure, as well as the risks, benefits, and alternatives were carefully and thoroughly reviewed with the patient. Ample time for discussion and questions allowed. The patient understood, was satisfied, and agreed to proceed.     HPI: Danielle Travis is a 43 y.o. female who presents for colonoscopy for evaluation of colon cancer screening .  Patient was most recently seen in the Gastroenterology Clinic on 05/09/23.  No interval change in medical history since that appointment. Please refer to that note for full details regarding GI history and clinical presentation.   Past Medical History:  Diagnosis Date   No pertinent past medical history     Past Surgical History:  Procedure Laterality Date   NO PAST SURGERIES      Prior to Admission medications   Medication Sig Start Date End Date Taking? Authorizing Provider  bisacodyl (DULCOLAX) 5 MG EC tablet Take 15 mg by mouth daily as needed.   Yes [provider]  Na Sulfate-K Sulfate-Mg Sulf 17.5-3.13-1.6 GM/177ML SOLN Use as directed; may use generic; goodrx card if insurance will not cover generic 05/09/23  Yes Imogene Burn, MD  atorvastatin (LIPITOR) 20 MG tablet Take 1 tablet (20 mg total) by mouth daily. Patient not taking: Reported on 07/02/2023 02/26/23   Grayce Sessions, NP  linaclotide Karlene Einstein) 145 MCG CAPS capsule Take 1 capsule (145 mcg total) by mouth daily before breakfast. Patient not taking: Reported on 07/02/2023 05/09/23 05/03/24  Imogene Burn, MD    Current Outpatient Medications  Medication Sig Dispense Refill   bisacodyl (DULCOLAX) 5 MG EC tablet Take 15 mg by mouth daily as needed.     Na Sulfate-K Sulfate-Mg Sulf 17.5-3.13-1.6  GM/177ML SOLN Use as directed; may use generic; goodrx card if insurance will not cover generic 354 mL 0   atorvastatin (LIPITOR) 20 MG tablet Take 1 tablet (20 mg total) by mouth daily. (Patient not taking: Reported on 07/02/2023) 90 tablet 1   linaclotide (LINZESS) 145 MCG CAPS capsule Take 1 capsule (145 mcg total) by mouth daily before breakfast. (Patient not taking: Reported on 07/02/2023) 30 capsule 3   Current Facility-Administered Medications  Medication Dose Route Frequency Provider Last Rate Last Admin   0.9 %  sodium chloride infusion  500 mL Intravenous Once Imogene Burn, MD        Allergies as of 07/02/2023   (No Known Allergies)    Family History  Problem Relation Age of Onset   Diabetes Mother    Hypertension Father    Anesthesia problems Neg Hx    Colon cancer Neg Hx    Esophageal cancer Neg Hx    Stomach cancer Neg Hx    Rectal cancer Neg Hx     Social History   Socioeconomic History   Marital status: Married    Spouse name: Not on file   Number of children: Not on file   Years of education: Not on file   Highest education level: Not on file  Occupational History   Not on file  Tobacco Use   Smoking status: Never   Smokeless tobacco: Never  Vaping Use   Vaping status: Never Used  Substance and Sexual Activity   Alcohol use: No  Drug use: No   Sexual activity: Yes    Birth control/protection: None  Other Topics Concern   Not on file  Social History Narrative   Not on file   Social Drivers of Health   Financial Resource Strain: Not at Risk (05/10/2022)   Received from [place], [employer]    Financial Resource Strain: 1  Food Insecurity: Not at Risk (05/10/2022)   Received from [place], [place]   Food Insecurity    Food: 1  Transportation Needs: Not at Risk (05/10/2022)   Received from [place], [employer] Needs    Transportation: 1  Physical Activity: Insufficiently Active (01/15/2023)   Exercise Vital Sign    Days of  Exercise per Week: 2 days    Minutes of Exercise per Session: 20 min  Stress: No Stress Concern Present (01/15/2023)   [employer] of Occupational Health - Occupational Stress Questionnaire    Feeling of Stress : Not at all  Social Connections: Not on File (01/14/2023)   Received from [employer]   Social Connections    Connectedness: 0  Intimate Partner Violence: Not on file    Physical Exam: Vital signs in last 24 hours: BP 130/82   Pulse 95   Temp 98.8 F (37.1 C) (Temporal)   Ht 5\' 2"  (1.575 m)   Wt 136 lb (61.7 kg)   LMP 06/22/2023 (Exact Date)   SpO2 100%   Breastfeeding No   BMI 24.87 kg/m  GEN: NAD EYE: Sclerae anicteric ENT: MMM CV: Non-tachycardic Pulm: No increased WOB GI: Soft NEURO:  Alert & Oriented   Eulah Pont, MD [facility]   07/02/2023 11:08 AM

## 2023-07-02 NOTE — Progress Notes (Signed)
 Interpreter used today at the [facility] for this pt.  Interpreter's name is- Ignatius Specking- Arabic   Pt's states no medical or surgical changes since previsit or office visit.

## 2023-07-03 ENCOUNTER — Telehealth: Payer: Self-pay | Admitting: *Deleted

## 2023-07-03 NOTE — Telephone Encounter (Signed)
  Follow up Call-     07/02/2023   10:43 AM  Call back number  Post procedure Call Back phone  # [phone]- husband speaks English  Permission to leave phone message Yes     Patient questions:  Do you have a fever, pain , or abdominal swelling? No. Pain Score  0 *  Have you tolerated food without any problems? Yes.    Have you been able to return to your normal activities? Yes.    Do you have any questions about your discharge instructions: Diet   No. Medications  No. Follow up visit  No.  Do you have questions or concerns about your Care? No.  Actions: * If pain score is 4 or above: No action needed, pain <4.

## 2023-07-04 ENCOUNTER — Encounter: Payer: Self-pay | Admitting: Internal Medicine

## 2023-07-04 LAB — SURGICAL PATHOLOGY

## 2023-07-06 DIAGNOSIS — Z419 Encounter for procedure for purposes other than remedying health state, unspecified: Secondary | ICD-10-CM | POA: Diagnosis not present

## 2023-08-17 DIAGNOSIS — Z419 Encounter for procedure for purposes other than remedying health state, unspecified: Secondary | ICD-10-CM | POA: Diagnosis not present

## 2023-09-16 DIAGNOSIS — Z419 Encounter for procedure for purposes other than remedying health state, unspecified: Secondary | ICD-10-CM | POA: Diagnosis not present

## 2023-10-17 DIAGNOSIS — Z419 Encounter for procedure for purposes other than remedying health state, unspecified: Secondary | ICD-10-CM | POA: Diagnosis not present

## 2023-11-16 DIAGNOSIS — Z419 Encounter for procedure for purposes other than remedying health state, unspecified: Secondary | ICD-10-CM | POA: Diagnosis not present

## 2023-11-26 ENCOUNTER — Ambulatory Visit (INDEPENDENT_AMBULATORY_CARE_PROVIDER_SITE_OTHER): Payer: Medicaid Other | Admitting: Primary Care

## 2023-11-26 ENCOUNTER — Encounter (INDEPENDENT_AMBULATORY_CARE_PROVIDER_SITE_OTHER): Payer: Self-pay | Admitting: Primary Care

## 2023-11-26 VITALS — BP 130/87 | HR 84 | Resp 16 | Wt 130.2 lb

## 2023-11-26 DIAGNOSIS — N921 Excessive and frequent menstruation with irregular cycle: Secondary | ICD-10-CM

## 2023-11-26 DIAGNOSIS — H6120 Impacted cerumen, unspecified ear: Secondary | ICD-10-CM

## 2023-11-26 DIAGNOSIS — E782 Mixed hyperlipidemia: Secondary | ICD-10-CM

## 2023-11-26 DIAGNOSIS — R7303 Prediabetes: Secondary | ICD-10-CM | POA: Diagnosis not present

## 2023-11-26 LAB — POCT URINE PREGNANCY: Preg Test, Ur: NEGATIVE

## 2023-11-26 NOTE — Progress Notes (Signed)
 [facility]  Danielle Travis, is a 43 y.o. female  RDW:[id number]  FMW:[id number]  DOB - Jun 19, 1980  Chief Complaint  Patient presents with   Hyperlipidemia   Prediabetes       Subjective:   Danielle Travis is a 43 y.o. female here today -(her husband Danielle Travis interpreting for his wife which she gave consent to be at this appointment.) Husband is very disappointed  because this is what his wife wants another child. Is at her side she she he is concerned about being pregnant last menstrual cycle was November 04, 2023.  Test results are negative.  May be in pretty menopausal phase however  pregnancy can occur during that time period.  she presents today for management of pa prediabetes -Denies polyuria, polydipsia, polyphasia or vision changes.  Left eye does twitch at times.  Does not check blood sugars at home. Patient has No headache, No chest pain, No abdominal pain - No Nausea, No new weakness tingling or numbness, No Cough - shortness of breath   No problems updated.  Comprehensive ROS Pertinent positive and negative noted in HPI   No Known Allergies  Past Medical History:  Diagnosis Date   No pertinent past medical history     Current Outpatient Medications on File Prior to Visit  Medication Sig Dispense Refill   atorvastatin  (LIPITOR) 20 MG tablet Take 1 tablet (20 mg total) by mouth daily. (Patient not taking: Reported on 07/02/2023) 90 tablet 1   bisacodyl (DULCOLAX) 5 MG EC tablet Take 15 mg by mouth daily as needed.     linaclotide  (LINZESS ) 145 MCG CAPS capsule Take 1 capsule (145 mcg total) by mouth daily before breakfast. (Patient not taking: Reported on 07/02/2023) 30 capsule 3   Na Sulfate-K Sulfate-Mg Sulf 17.5-3.13-1.6 GM/177ML SOLN Use as directed; may use generic; goodrx card if insurance will not cover generic 354 mL 0   No current facility-administered medications on file prior to visit.   Health Maintenance  Topic Date Due   Hepatitis B Vaccine  (1 of 3 - 19+ 3-dose series) Never done   HPV Vaccine (1 - 3-dose SCDM series) Never done   COVID-19 Vaccine (4 - 2024-25 season) 01/06/2023   DTaP/Tdap/Td vaccine (2 - Td or Tdap) 02/26/2024*   Flu Shot  12/06/2023   Pap with HPV screening  02/25/2026   Colon Cancer Screening  07/01/2030   Hepatitis C Screening  Completed   HIV Screening  Completed   Meningitis B Vaccine  Aged Out  *Topic was postponed. The date shown is not the original due date.    Objective:   Vitals:   11/26/23 1023  BP: 130/87  Pulse: 84  Resp: 16  SpO2: 99%  Weight: 130 lb 3.2 oz (59.1 kg)   BP Readings from Last 3 Encounters:  11/26/23 130/87  07/02/23 (!) 116/53  05/29/23 125/85      Physical Exam Vitals reviewed.  Constitutional:      Appearance: Normal appearance.  HENT:     Head: Normocephalic.     Right Ear: External ear normal. There is impacted cerumen.     Left Ear: External ear normal. There is impacted cerumen.     Nose: Nose normal.     Mouth/Throat:     Mouth: Mucous membranes are moist.  Eyes:     Extraocular Movements: Extraocular movements intact.     Pupils: Pupils are equal, round, and reactive to light.  Cardiovascular:     Rate and Rhythm: Normal  rate.  Pulmonary:     Effort: Pulmonary effort is normal.     Breath sounds: Normal breath sounds.  Abdominal:     General: Bowel sounds are normal.     Palpations: Abdomen is soft.  Musculoskeletal:        General: Normal range of motion.     Cervical back: Normal range of motion.  Skin:    General: Skin is warm and dry.  Neurological:     Mental Status: She is alert and oriented to person, place, and time.  Psychiatric:        Mood and Affect: Mood normal.        Behavior: Behavior normal.        Thought Content: Thought content normal.       Assessment & Plan  Danielle Travis was seen today for hyperlipidemia and prediabetes.  Diagnoses and all orders for this visit:  Prediabetes  monitor carbohydrates -rice,  potatoes, tortillas, [employer] Flour, breads, pasta, sweets, sodas.  Increase exercising to help maintain appropriate weight.  -     Lipid panel -     CMP14+EGFR -     Hemoglobin A1c  Mixed hyperlipidemia -     Lipid panel  Menorrhagia with irregular cycle -     CBC with Differential -     POCT urine pregnancy- negative  Other orders -     Specimen status report -     Specimen status report -     CMP14+EGFR -     Lipid panel -     Specimen status report   Impacted cerumen, unspecified laterality  RTN for ear irrigation  Patient have been counseled extensively about nutrition and exercise. Other issues discussed during this visit include: low cholesterol diet, weight control and daily exercise, foot care, annual eye examinations at Ophthalmology, importance of adherence with medications and regular follow-up. We also discussed long term complications of uncontrolled diabetes and hypertension.   Return in about 3 months (around 02/26/2024) for on a Fri incase ear irriagation .  The patient was given clear instructions to go to ER or return to medical center if symptoms don't improve, worsen or new problems develop. The patient verbalized understanding. The patient was told to call to get lab results if they haven't heard anything in the next week.   This note has been created with Education officer, environmental. Any transcriptional errors are unintentional.   Danielle SHAUNNA Bohr, NP 12/02/2023, 1:49 PM

## 2023-11-27 LAB — CMP14+EGFR

## 2023-11-27 LAB — LIPID PANEL

## 2023-11-27 LAB — SPECIMEN STATUS REPORT

## 2023-11-29 LAB — CBC WITH DIFFERENTIAL/PLATELET

## 2023-11-29 LAB — CMP14+EGFR

## 2023-11-29 LAB — SPECIMEN STATUS REPORT

## 2023-11-29 LAB — LIPID PANEL

## 2023-11-29 LAB — HEMOGLOBIN A1C
Est. average glucose Bld gHb Est-mCnc: 120 mg/dL
Hgb A1c MFr Bld: 5.8 % — ABNORMAL HIGH (ref 4.8–5.6)

## 2023-11-30 LAB — CMP14+EGFR
ALT: 7 IU/L (ref 0–32)
AST: 12 IU/L (ref 0–40)
Albumin: 4.4 g/dL (ref 3.9–4.9)
Alkaline Phosphatase: 69 IU/L (ref 44–121)
BUN/Creatinine Ratio: 18 (ref 9–23)
BUN: 13 mg/dL (ref 6–24)
Bilirubin Total: 0.4 mg/dL (ref 0.0–1.2)
CO2: 19 mmol/L — ABNORMAL LOW (ref 20–29)
Calcium: 9.3 mg/dL (ref 8.7–10.2)
Chloride: 104 mmol/L (ref 96–106)
Creatinine, Ser: 0.71 mg/dL (ref 0.57–1.00)
Globulin, Total: 2.8 g/dL (ref 1.5–4.5)
Glucose: 78 mg/dL (ref 70–99)
Potassium: 4.6 mmol/L (ref 3.5–5.2)
Sodium: 138 mmol/L (ref 134–144)
Total Protein: 7.2 g/dL (ref 6.0–8.5)
eGFR: 107 mL/min/1.73 (ref >59)

## 2023-11-30 LAB — LIPID PANEL
Chol/HDL Ratio: 4.1 ratio (ref 0.0–4.4)
Cholesterol, Total: 193 mg/dL (ref 100–199)
HDL: 47 mg/dL (ref >39)
LDL Chol Calc (NIH): 124 mg/dL — ABNORMAL HIGH (ref 0–99)
Triglycerides: 120 mg/dL (ref 0–149)
VLDL Cholesterol Cal: 22 mg/dL (ref 5–40)

## 2023-11-30 LAB — SPECIMEN STATUS REPORT

## 2023-12-01 ENCOUNTER — Ambulatory Visit: Payer: Self-pay | Admitting: Primary Care

## 2023-12-17 DIAGNOSIS — Z419 Encounter for procedure for purposes other than remedying health state, unspecified: Secondary | ICD-10-CM | POA: Diagnosis not present

## 2024-01-17 DIAGNOSIS — Z419 Encounter for procedure for purposes other than remedying health state, unspecified: Secondary | ICD-10-CM | POA: Diagnosis not present

## 2024-02-27 ENCOUNTER — Telehealth (INDEPENDENT_AMBULATORY_CARE_PROVIDER_SITE_OTHER): Payer: Self-pay | Admitting: Primary Care

## 2024-02-27 NOTE — Telephone Encounter (Signed)
 Called pt to confirm appt. Pt will be present.

## 2024-02-28 ENCOUNTER — Encounter (INDEPENDENT_AMBULATORY_CARE_PROVIDER_SITE_OTHER): Payer: Self-pay | Admitting: Primary Care

## 2024-02-28 ENCOUNTER — Ambulatory Visit (INDEPENDENT_AMBULATORY_CARE_PROVIDER_SITE_OTHER): Admitting: Primary Care

## 2024-02-28 VITALS — BP 120/78 | HR 81 | Resp 16 | Wt 135.2 lb

## 2024-02-28 DIAGNOSIS — R300 Dysuria: Secondary | ICD-10-CM

## 2024-02-28 DIAGNOSIS — H6123 Impacted cerumen, bilateral: Secondary | ICD-10-CM

## 2024-02-28 DIAGNOSIS — I10 Essential (primary) hypertension: Secondary | ICD-10-CM | POA: Diagnosis not present

## 2024-02-28 DIAGNOSIS — E782 Mixed hyperlipidemia: Secondary | ICD-10-CM | POA: Diagnosis not present

## 2024-02-28 LAB — POCT URINALYSIS DIP (CLINITEK)
Bilirubin, UA: NEGATIVE
Glucose, UA: NEGATIVE mg/dL
Ketones, POC UA: NEGATIVE mg/dL
Leukocytes, UA: NEGATIVE
Nitrite, UA: NEGATIVE
POC PROTEIN,UA: NEGATIVE
Spec Grav, UA: 1.02 (ref 1.010–1.025)
Urobilinogen, UA: 0.2 U/dL
pH, UA: 5.5 (ref 5.0–8.0)

## 2024-02-28 NOTE — Progress Notes (Signed)
 [facility]  Danielle Travis, is a 43 y.o. female  [id number]  FMW:[id number]  DOB - 02-Dec-1980  Chief Complaint  Patient presents with   Hyperlipidemia       Subjective:   Danielle Travis is a 43 y.o. Arabic female (she has given permission to her and has been Mohammed to be present at her appointment and also interpret for her.  Here today for an acute visit c/o dysuria and in for fasting labs.  We discussed increased risk for urinary tract infections patient verbalized understanding. Recently loss her father(CVA)  and her husband mother    No problems updated.  Comprehensive ROS Pertinent positive and negative noted in HPI   No Known Allergies  Past Medical History:  Diagnosis Date   No pertinent past medical history     Current Outpatient Medications on File Prior to Visit  Medication Sig Dispense Refill   atorvastatin  (LIPITOR) 20 MG tablet Take 1 tablet (20 mg total) by mouth daily. (Patient not taking: Reported on 07/02/2023) 90 tablet 1   bisacodyl (DULCOLAX) 5 MG EC tablet Take 15 mg by mouth daily as needed.     linaclotide  (LINZESS ) 145 MCG CAPS capsule Take 1 capsule (145 mcg total) by mouth daily before breakfast. (Patient not taking: Reported on 07/02/2023) 30 capsule 3   Na Sulfate-K Sulfate-Mg Sulf 17.5-3.13-1.6 GM/177ML SOLN Use as directed; may use generic; goodrx card if insurance will not cover generic 354 mL 0   No current facility-administered medications on file prior to visit.   Health Maintenance  Topic Date Due   Hepatitis B Vaccine (1 of 3 - 19+ 3-dose series) Never done   HPV Vaccine (1 - 3-dose SCDM series) Never done   DTaP/Tdap/Td vaccine (2 - Td or Tdap) 12/16/2021   COVID-19 Vaccine (4 - 2025-26 season) 01/06/2024   Flu Shot  08/04/2024*   Breast Cancer Screening  07/08/2024   Pap with HPV screening  02/25/2026   Colon Cancer Screening  07/01/2030   Hepatitis C Screening  Completed   HIV Screening  Completed    Pneumococcal Vaccine  Aged Out   Meningitis B Vaccine  Aged Out  *Topic was postponed. The date shown is not the original due date.    Objective:   BP Readings from Last 3 Encounters:  02/28/24 120/78  11/26/23 130/87  07/02/23 (!) 116/53      Physical Exam Vitals reviewed.  Constitutional:      Appearance: Normal appearance.  HENT:     Head: Normocephalic.     Right Ear: External ear normal. There is impacted cerumen.     Left Ear: External ear normal. There is impacted cerumen.     Nose: Nose normal.     Mouth/Throat:     Mouth: Mucous membranes are moist.  Eyes:     Extraocular Movements: Extraocular movements intact.     Pupils: Pupils are equal, round, and reactive to light.  Cardiovascular:     Rate and Rhythm: Normal rate.  Pulmonary:     Effort: Pulmonary effort is normal.     Breath sounds: Normal breath sounds.  Abdominal:     General: Bowel sounds are normal.     Palpations: Abdomen is soft.  Musculoskeletal:        General: Normal range of motion.     Cervical back: Normal range of motion.  Skin:    General: Skin is warm and dry.  Neurological:     Mental Status: She is alert  and oriented to person, place, and time.  Psychiatric:        Mood and Affect: Mood normal.        Behavior: Behavior normal.        Thought Content: Thought content normal.      Assessment & Plan  Danielle Travis was seen today for hyperlipidemia.  Diagnoses and all orders for this visit:  Dysuria -     POCT URINALYSIS DIP (CLINITEK)  Mixed hyperlipidemia On statin   Healthy lifestyle diet of fruits vegetables fish nuts whole grains and low saturated fat . Foods high in cholesterol or liver, fatty meats,cheese, butter avocados, nuts and seeds, chocolate and fried foods.  Bilateral impacted cerumen Rtn for irrigation  Elevated blood pressure reading with diagnosis of hypertension 2 deaths in a month see HPI      Patient have been counseled extensively about nutrition and  exercise. Other issues discussed during this visit include: low cholesterol diet, weight control and daily exercise, foot care, annual eye examinations at Ophthalmology, importance of adherence with medications and regular follow-up. We also discussed long term complications of uncontrolled diabetes and hypertension.   Return in about 3 months (around 05/30/2024).  The patient was given clear instructions to go to ER or return to medical center if symptoms don't improve, worsen or new problems develop. The patient verbalized understanding. The patient was told to call to get lab results if they haven't heard anything in the next week.   This note has been created with Education officer, environmental. Any transcriptional errors are unintentional.   Danielle SHAUNNA Bohr, NP 02/28/2024, 10:46 AM

## 2024-02-29 LAB — LIPID PANEL
Chol/HDL Ratio: 4.6 ratio — ABNORMAL HIGH (ref 0.0–4.4)
Cholesterol, Total: 210 mg/dL — ABNORMAL HIGH (ref 100–199)
HDL: 46 mg/dL (ref >39)
LDL Chol Calc (NIH): 140 mg/dL — ABNORMAL HIGH (ref 0–99)
Triglycerides: 135 mg/dL (ref 0–149)
VLDL Cholesterol Cal: 24 mg/dL (ref 5–40)

## 2024-03-02 ENCOUNTER — Ambulatory Visit (INDEPENDENT_AMBULATORY_CARE_PROVIDER_SITE_OTHER): Payer: Self-pay | Admitting: Primary Care

## 2024-03-02 ENCOUNTER — Other Ambulatory Visit (INDEPENDENT_AMBULATORY_CARE_PROVIDER_SITE_OTHER): Payer: Self-pay | Admitting: Primary Care

## 2024-03-02 DIAGNOSIS — E782 Mixed hyperlipidemia: Secondary | ICD-10-CM

## 2024-03-02 MED ORDER — ATORVASTATIN CALCIUM 20 MG PO TABS: 20.0000 mg | ORAL_TABLET | Freq: Every day | ORAL | 1 refills | Status: AC

## 2024-03-04 ENCOUNTER — Encounter (INDEPENDENT_AMBULATORY_CARE_PROVIDER_SITE_OTHER): Payer: Self-pay | Admitting: Primary Care

## 2024-03-05 NOTE — Telephone Encounter (Signed)
 Will forward to provider

## 2024-03-18 DIAGNOSIS — Z419 Encounter for procedure for purposes other than remedying health state, unspecified: Secondary | ICD-10-CM | POA: Diagnosis not present

## 2024-04-09 ENCOUNTER — Telehealth (INDEPENDENT_AMBULATORY_CARE_PROVIDER_SITE_OTHER): Payer: Self-pay | Admitting: Primary Care

## 2024-04-09 NOTE — Telephone Encounter (Signed)
 Spoke to pt about upcoming appt.. Will be present

## 2024-04-10 ENCOUNTER — Encounter (INDEPENDENT_AMBULATORY_CARE_PROVIDER_SITE_OTHER): Payer: Self-pay | Admitting: Primary Care

## 2024-04-10 ENCOUNTER — Ambulatory Visit (INDEPENDENT_AMBULATORY_CARE_PROVIDER_SITE_OTHER): Admitting: Primary Care

## 2024-04-10 VITALS — BP 123/86 | HR 77 | Resp 16 | Wt 135.6 lb

## 2024-04-10 DIAGNOSIS — E782 Mixed hyperlipidemia: Secondary | ICD-10-CM

## 2024-04-10 DIAGNOSIS — H6123 Impacted cerumen, bilateral: Secondary | ICD-10-CM | POA: Diagnosis not present

## 2024-04-10 DIAGNOSIS — N921 Excessive and frequent menstruation with irregular cycle: Secondary | ICD-10-CM | POA: Diagnosis not present

## 2024-04-10 NOTE — Progress Notes (Unsigned)
  [facility]  Dwan Fennel, is a 43 y.o. female  RDW:[id number]  FMW:[id number]  DOB - 1980/06/19  Chief Complaint  Patient presents with   Anxiety       Subjective:   Danielle Travis is a 43 y.o. female here today for an acute visit.  HPI  No problems updated.  Comprehensive ROS Pertinent positive and negative noted in HPI   No Known Allergies  Past Medical History:  Diagnosis Date   No pertinent past medical history     Current Outpatient Medications on File Prior to Visit  Medication Sig Dispense Refill   atorvastatin  (LIPITOR) 20 MG tablet Take 1 tablet (20 mg total) by mouth daily. 90 tablet 1   bisacodyl (DULCOLAX) 5 MG EC tablet Take 15 mg by mouth daily as needed.     linaclotide  (LINZESS ) 145 MCG CAPS capsule Take 1 capsule (145 mcg total) by mouth daily before breakfast. (Patient not taking: Reported on 07/02/2023) 30 capsule 3   Na Sulfate-K Sulfate-Mg Sulf 17.5-3.13-1.6 GM/177ML SOLN Use as directed; may use generic; goodrx card if insurance will not cover generic 354 mL 0   No current facility-administered medications on file prior to visit.   Health Maintenance  Topic Date Due   Hepatitis B Vaccine (1 of 3 - 19+ 3-dose series) Never done   HPV Vaccine (1 - 3-dose SCDM series) Never done   DTaP/Tdap/Td vaccine (2 - Td or Tdap) 12/16/2021   COVID-19 Vaccine (4 - 2025-26 season) 01/06/2024   Flu Shot  08/04/2024*   Breast Cancer Screening  07/08/2024   Pap with HPV screening  02/25/2026   Colon Cancer Screening  07/01/2030   Hepatitis C Screening  Completed   HIV Screening  Completed   Pneumococcal Vaccine  Aged Out   Meningitis B Vaccine  Aged Out  *Topic was postponed. The date shown is not the original due date.    Objective:   Vitals:   04/10/24 0937  BP: 123/86  Pulse: 77  Resp: 16  SpO2: 99%  Weight: 135 lb 9.6 oz (61.5 kg)   {Vitals History (Optional):23777}  Physical Exam  {Labs (Optional):23779}  Assessment &  Plan   Menorrhagia with irregular cycle -     CBC with Differential/Platelet; Future  Mixed hyperlipidemia -     Lipid panel; Future -     CMP14+EGFR; Future  Bilateral impacted cerumen     Patient have been counseled extensively about nutrition and exercise. Other issues discussed during this visit include: low cholesterol diet, weight control and daily exercise, foot care, annual eye examinations at Ophthalmology, importance of adherence with medications and regular follow-up. We also discussed long term complications of uncontrolled diabetes and hypertension.   No follow-ups on file.  The patient was given clear instructions to go to ER or return to medical center if symptoms don't improve, worsen or new problems develop. The patient verbalized understanding. The patient was told to call to get lab results if they haven't heard anything in the next week.   This note has been created with Education officer, environmental. Any transcriptional errors are unintentional.   Danielle SHAUNNA Bohr, NP 04/10/2024, 10:27 AM

## 2024-05-20 ENCOUNTER — Telehealth (INDEPENDENT_AMBULATORY_CARE_PROVIDER_SITE_OTHER): Payer: Self-pay | Admitting: Primary Care

## 2024-05-20 NOTE — Telephone Encounter (Signed)
 Left VM with pt about their upcoming appt. Pt did not answer

## 2024-05-29 ENCOUNTER — Telehealth: Payer: Self-pay | Admitting: Primary Care

## 2024-05-29 NOTE — Telephone Encounter (Signed)
 05/29/24 No voicemail

## 2024-05-29 NOTE — Telephone Encounter (Signed)
 Called pt to reschedule appt. Pt was rescheduled to 1/29. Please advise.

## 2024-06-01 ENCOUNTER — Ambulatory Visit (INDEPENDENT_AMBULATORY_CARE_PROVIDER_SITE_OTHER): Admitting: Primary Care

## 2024-06-03 ENCOUNTER — Telehealth (INDEPENDENT_AMBULATORY_CARE_PROVIDER_SITE_OTHER): Payer: Self-pay | Admitting: Primary Care

## 2024-06-03 NOTE — Telephone Encounter (Signed)
 Spoke to pt about upcoming appt.. Will be present

## 2024-06-04 ENCOUNTER — Encounter (INDEPENDENT_AMBULATORY_CARE_PROVIDER_SITE_OTHER): Admitting: Primary Care

## 2024-06-04 ENCOUNTER — Other Ambulatory Visit: Payer: Self-pay

## 2024-06-04 ENCOUNTER — Other Ambulatory Visit (INDEPENDENT_AMBULATORY_CARE_PROVIDER_SITE_OTHER): Payer: Self-pay | Admitting: Primary Care

## 2024-06-04 ENCOUNTER — Encounter (INDEPENDENT_AMBULATORY_CARE_PROVIDER_SITE_OTHER): Payer: Self-pay | Admitting: Primary Care

## 2024-06-04 VITALS — BP 118/82 | HR 81 | Temp 97.2°F | Resp 16 | Ht 61.5 in | Wt 137.6 lb

## 2024-06-04 DIAGNOSIS — N921 Excessive and frequent menstruation with irregular cycle: Secondary | ICD-10-CM

## 2024-06-04 DIAGNOSIS — R4689 Other symptoms and signs involving appearance and behavior: Secondary | ICD-10-CM

## 2024-06-04 DIAGNOSIS — E782 Mixed hyperlipidemia: Secondary | ICD-10-CM

## 2024-06-04 DIAGNOSIS — Z1231 Encounter for screening mammogram for malignant neoplasm of breast: Secondary | ICD-10-CM

## 2024-06-04 NOTE — Progress Notes (Signed)
 Patient in for labs only unable to draw labs at [facility] will direct her to [facility]

## 2024-06-05 LAB — CBC WITH DIFFERENTIAL/PLATELET
Basophils Absolute: 0.1 10*3/uL (ref 0.0–0.2)
Basos: 1 %
EOS (ABSOLUTE): 0 10*3/uL (ref 0.0–0.4)
Eos: 1 %
Hematocrit: 43.1 % (ref 34.0–46.6)
Hemoglobin: 13.8 g/dL (ref 11.1–15.9)
Immature Grans (Abs): 0 10*3/uL (ref 0.0–0.1)
Immature Granulocytes: 0 %
Lymphocytes Absolute: 3 10*3/uL (ref 0.7–3.1)
Lymphs: 46 %
MCH: 29.9 pg (ref 26.6–33.0)
MCHC: 32 g/dL (ref 31.5–35.7)
MCV: 94 fL (ref 79–97)
Monocytes Absolute: 0.5 10*3/uL (ref 0.1–0.9)
Monocytes: 7 %
Neutrophils Absolute: 2.9 10*3/uL (ref 1.4–7.0)
Neutrophils: 45 %
Platelets: 302 10*3/uL (ref 150–450)
RBC: 4.61 x10E6/uL (ref 3.77–5.28)
RDW: 13.3 % (ref 11.7–15.4)
WBC: 6.5 10*3/uL (ref 3.4–10.8)

## 2024-06-05 LAB — CMP14+EGFR
ALT: 9 [IU]/L (ref 0–32)
AST: 9 [IU]/L (ref 0–40)
Albumin: 4.5 g/dL (ref 3.9–4.9)
Alkaline Phosphatase: 71 [IU]/L (ref 41–116)
BUN/Creatinine Ratio: 18 (ref 9–23)
BUN: 13 mg/dL (ref 6–24)
Bilirubin Total: 0.4 mg/dL (ref 0.0–1.2)
CO2: 21 mmol/L (ref 20–29)
Calcium: 9.3 mg/dL (ref 8.7–10.2)
Chloride: 103 mmol/L (ref 96–106)
Creatinine, Ser: 0.71 mg/dL (ref 0.57–1.00)
Globulin, Total: 2.9 g/dL (ref 1.5–4.5)
Glucose: 92 mg/dL (ref 70–99)
Potassium: 4.5 mmol/L (ref 3.5–5.2)
Sodium: 140 mmol/L (ref 134–144)
Total Protein: 7.4 g/dL (ref 6.0–8.5)
eGFR: 107 mL/min/{1.73_m2}

## 2024-06-05 LAB — LIPID PANEL
Chol/HDL Ratio: 5.6 ratio — ABNORMAL HIGH (ref 0.0–4.4)
Cholesterol, Total: 245 mg/dL — ABNORMAL HIGH (ref 100–199)
HDL: 44 mg/dL
LDL Chol Calc (NIH): 173 mg/dL — ABNORMAL HIGH (ref 0–99)
Triglycerides: 152 mg/dL — ABNORMAL HIGH (ref 0–149)
VLDL Cholesterol Cal: 28 mg/dL (ref 5–40)

## 2024-06-25 ENCOUNTER — Ambulatory Visit
